# Patient Record
Sex: Female | Born: 2000 | Race: White | Hispanic: No | Marital: Single | Smoking: Never smoker
Health system: Southern US, Community
[De-identification: ages and names within clinical notes are randomized; demographics above are authoritative.]

## PROBLEM LIST (undated history)

## (undated) ENCOUNTER — Inpatient Hospital Stay (HOSPITAL_COMMUNITY): Payer: Self-pay

## (undated) ENCOUNTER — Emergency Department (HOSPITAL_COMMUNITY)

## (undated) DIAGNOSIS — Z789 Other specified health status: Secondary | ICD-10-CM

## (undated) HISTORY — DX: Other specified health status: Z78.9

## (undated) HISTORY — PX: TONGUE SURGERY: SHX810

---
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
## 2019-04-16 DIAGNOSIS — O0990 Supervision of high risk pregnancy, unspecified, unspecified trimester: Secondary | ICD-10-CM | POA: Insufficient documentation

## 2019-05-13 DIAGNOSIS — O99210 Obesity complicating pregnancy, unspecified trimester: Secondary | ICD-10-CM | POA: Insufficient documentation

## 2019-06-10 DIAGNOSIS — O35EXX0 Maternal care for other (suspected) fetal abnormality and damage, fetal genitourinary anomalies, not applicable or unspecified: Secondary | ICD-10-CM | POA: Insufficient documentation
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
## 2019-09-04 DIAGNOSIS — O1490 Unspecified pre-eclampsia, unspecified trimester: Secondary | ICD-10-CM

## 2019-09-06 DIAGNOSIS — D62 Acute posthemorrhagic anemia: Secondary | ICD-10-CM | POA: Insufficient documentation

## 2020-05-14 HISTORY — PX: CHOLECYSTECTOMY: SHX55

## 2021-12-18 DIAGNOSIS — G8918 Other acute postprocedural pain: Secondary | ICD-10-CM | POA: Insufficient documentation

## 2022-09-14 ENCOUNTER — Encounter (HOSPITAL_BASED_OUTPATIENT_CLINIC_OR_DEPARTMENT_OTHER): Payer: Self-pay | Admitting: Urology

## 2022-09-14 ENCOUNTER — Other Ambulatory Visit: Payer: Self-pay

## 2022-09-14 ENCOUNTER — Emergency Department (HOSPITAL_BASED_OUTPATIENT_CLINIC_OR_DEPARTMENT_OTHER)
Admission: EM | Admit: 2022-09-14 | Discharge: 2022-09-14 | Disposition: A | Discharge status: Home / Self Care | Payer: Medicaid Other | Attending: Emergency Medicine | Admitting: Emergency Medicine

## 2022-09-14 DIAGNOSIS — R519 Headache, unspecified: Secondary | ICD-10-CM | POA: Diagnosis present

## 2022-09-14 DIAGNOSIS — R000 Tachycardia, unspecified: Secondary | ICD-10-CM | POA: Insufficient documentation

## 2022-09-14 DIAGNOSIS — G44209 Tension-type headache, unspecified, not intractable: Secondary | ICD-10-CM | POA: Insufficient documentation

## 2022-09-14 LAB — URINALYSIS, ROUTINE W REFLEX MICROSCOPIC
Bilirubin Urine: NEGATIVE
Glucose, UA: NEGATIVE mg/dL
Ketones, ur: NEGATIVE mg/dL
Leukocytes,Ua: NEGATIVE
Nitrite: NEGATIVE
Protein, ur: NEGATIVE mg/dL
Specific Gravity, Urine: 1.02 (ref 1.005–1.030)
pH: 7.5 (ref 5.0–8.0)

## 2022-09-14 LAB — CBC WITH DIFFERENTIAL/PLATELET
Abs Immature Granulocytes: 0.04 10*3/uL (ref 0.00–0.07)
Basophils Absolute: 0.1 10*3/uL (ref 0.0–0.1)
Basophils Relative: 1 %
Eosinophils Absolute: 0.1 10*3/uL (ref 0.0–0.5)
Eosinophils Relative: 1 %
HCT: 38 % (ref 36.0–46.0)
Hemoglobin: 13.1 g/dL (ref 12.0–15.0)
Immature Granulocytes: 0 %
Lymphocytes Relative: 21 %
Lymphs Abs: 2.4 10*3/uL (ref 0.7–4.0)
MCH: 31.1 pg (ref 26.0–34.0)
MCHC: 34.5 g/dL (ref 30.0–36.0)
MCV: 90.3 fL (ref 80.0–100.0)
Monocytes Absolute: 0.8 10*3/uL (ref 0.1–1.0)
Monocytes Relative: 7 %
Neutro Abs: 7.8 10*3/uL — ABNORMAL HIGH (ref 1.7–7.7)
Neutrophils Relative %: 70 %
Platelets: 248 10*3/uL (ref 150–400)
RBC: 4.21 MIL/uL (ref 3.87–5.11)
RDW: 12.2 % (ref 11.5–15.5)
WBC: 11.2 10*3/uL — ABNORMAL HIGH (ref 4.0–10.5)
nRBC: 0 % (ref 0.0–0.2)

## 2022-09-14 LAB — URINALYSIS, MICROSCOPIC (REFLEX): WBC, UA: NONE SEEN WBC/hpf (ref 0–5)

## 2022-09-14 LAB — BASIC METABOLIC PANEL
Anion gap: 8 (ref 5–15)
BUN: 10 mg/dL (ref 6–20)
CO2: 24 mmol/L (ref 22–32)
Calcium: 9 mg/dL (ref 8.9–10.3)
Chloride: 107 mmol/L (ref 98–111)
Creatinine, Ser: 0.81 mg/dL (ref 0.44–1.00)
GFR, Estimated: >60 mL/min (ref >60)
Glucose, Bld: 109 mg/dL — ABNORMAL HIGH (ref 70–99)
Potassium: 3.5 mmol/L (ref 3.5–5.1)
Sodium: 139 mmol/L (ref 135–145)

## 2022-09-14 LAB — PREGNANCY, URINE: Preg Test, Ur: NEGATIVE

## 2022-09-14 MED ORDER — SODIUM CHLORIDE 0.9 % IV BOLUS
1000.0000 mL | Freq: Once | INTRAVENOUS | Status: AC
  Administered 2022-09-14: 1000 mL via INTRAVENOUS

## 2022-09-14 MED ORDER — PROCHLORPERAZINE EDISYLATE 10 MG/2ML IJ SOLN
10.0000 mg | Freq: Once | INTRAMUSCULAR | Status: AC
  Administered 2022-09-14: 10 mg via INTRAVENOUS
  Filled 2022-09-14: qty 2

## 2022-09-14 MED ORDER — HYDROXYZINE HCL 25 MG PO TABS
50.0000 mg | ORAL_TABLET | Freq: Once | ORAL | Status: AC
  Administered 2022-09-14: 50 mg via ORAL
  Filled 2022-09-14: qty 2

## 2022-09-14 NOTE — ED Provider Notes (Cosign Needed Addendum)
[place] EMERGENCY DEPARTMENT AT [facility] Provider Note   CSN: [id number] Arrival date & time: 09/14/22  1959     History  Chief Complaint  Patient presents with   Headache    Miranda Hayes is a 22 y.o. female history of migraines on abortive meds, anxiety presented with headache that began this morning.  Patient states she took her abortive medication including maxalt and Topamax.  She is also on hydroxyzine for anxiety and has been unable to take her medication due to her headache.  Patient is able to tolerate food and fluids orally but does endorse nausea throughout the day but not nauseous at the moment.  Describes the headache as a band around her head that is squeezing.  Patient notes that she had some black spots in her vision earlier and that after she took her Maxalt the black spots resolved along with the pressure behind her eye but still endorses a bandlike sensation around her head.  Patient dates that she is currently very anxious about this headache and would like anxiety medication as well.  Patient denied chest pain, shortness of breath, vomiting, abdominal pain, changes sensation/motor skills, neck pain, dysuria, recent illnesses, fever/chills, jaw pain, trauma  Home Medications Prior to Admission medications   Medication Sig Start Date End Date Taking? Authorizing Provider  levonorgestrel-ethinyl estradiol (VIENVA) 0.1-20 MG-MCG tablet Take 1 tablet by mouth daily.    [provider]      Allergies    Patient has no known allergies.    Review of Systems   Review of Systems  Neurological:  Positive for headaches.    Physical Exam Updated Vital Signs BP 116/85 (BP Location: Left Arm)   Pulse (!) 108   Temp 98.3 F (36.8 C) (Oral)   Resp 18   Ht 5\' 6"  (1.676 m)   Wt 91.2 kg   LMP 08/14/2022   SpO2 100%   BMI 32.45 kg/m  Physical Exam Vitals reviewed.  Constitutional:      General: She is not in acute distress. HENT:     Head:  Normocephalic and atraumatic.  Eyes:     Extraocular Movements: Extraocular movements intact.     Conjunctiva/sclera: Conjunctivae normal.     Pupils: Pupils are equal, round, and reactive to light.     Comments: No photophobia  Cardiovascular:     Rate and Rhythm: Regular rhythm. Tachycardia present.     Pulses: Normal pulses.     Heart sounds: Normal heart sounds.     Comments: 2+ bilateral radial/dorsalis pedis pulses with increased rate Pulmonary:     Effort: Pulmonary effort is normal. No respiratory distress.     Breath sounds: Normal breath sounds.  Abdominal:     Palpations: Abdomen is soft.     Tenderness: There is no abdominal tenderness. There is no guarding or rebound.  Musculoskeletal:        General: Normal range of motion.     Cervical back: Normal range of motion and neck supple.     Comments: 5 out of 5 bilateral grip/leg extension strength  Skin:    General: Skin is warm and dry.     Capillary Refill: Capillary refill takes less than 2 seconds.  Neurological:     General: No focal deficit present.     Mental Status: She is alert and oriented to person, place, and time.     Comments: Sensation intact in all 4 limbs Cranial nerves III through XII intact Vision  grossly intact Able to ambulate without abnormalities  Psychiatric:        Mood and Affect: Mood normal.     Comments: Anxious     ED Results / Procedures / Treatments   Labs (all labs ordered are listed, but only abnormal results are displayed) Labs Reviewed  URINALYSIS, ROUTINE W REFLEX MICROSCOPIC - Abnormal; Notable for the following components:      Result Value   APPearance HAZY (*)    Hgb urine dipstick TRACE (*)    All other components within normal limits  BASIC METABOLIC PANEL - Abnormal; Notable for the following components:   Glucose, Bld 109 (*)    All other components within normal limits  CBC WITH DIFFERENTIAL/PLATELET - Abnormal; Notable for the following components:   WBC 11.2  (*)    Neutro Abs 7.8 (*)    All other components within normal limits  URINALYSIS, MICROSCOPIC (REFLEX) - Abnormal; Notable for the following components:   Bacteria, UA FEW (*)    All other components within normal limits  PREGNANCY, URINE    EKG None  Radiology No results found.  Procedures Procedures    Medications Ordered in ED Medications  sodium chloride 0.9 % bolus 1,000 mL (1,000 mLs Intravenous New Bag/Given 09/14/22 2144)  hydrOXYzine (ATARAX) tablet 50 mg (50 mg Oral Given 09/14/22 2059)  prochlorperazine (COMPAZINE) injection 10 mg (10 mg Intravenous Given 09/14/22 2147)    ED Course/ Medical Decision Making/ A&P                             Medical Decision Making Amount and/or Complexity of Data Reviewed Labs: ordered.  Risk Prescription drug management.   Hassell Halim 23 y.o. presented today for headache and anxiety. Working DDx that I considered at this time includes, but not limited to, tension headache, migraine intracranial mass, intracranial hemorrhage, intracranial infection including meningitis vs encephalitis, GCA, trigeminal neuralgia.  R/o DDx: Migraine: These are considered less likely due to history of present illness and physical exam findings SAH/ICH: Timeline and slow onset is not consistent with SAH/ICH  GCA: Age and description of pain is not consistent with GCA  Meningitis/encephalitis: Lack of fever,meningismus is not consistent Intracranial Mass:  less likely due to history of present illness and physical exam findings Trigeminal Neuralgia: headache does not meet this description Intracranial Infection:  less likely due to history of present illness and physical exam findings, no fevers  Review of prior external notes: 11/13/2021 ED  Unique Tests and My Interpretation:  CBC: Unremarkable CMP: Unremarkable UA: Unremarkable UPT: Negative  Discussion with Independent Historian: None  Discussion of Management of Tests:  None  Risk: Medium: prescription drug management  Risk Stratification Score: None  Plan: Patient presented for HA. On exam patient was anxious but had stable vitals . Physical exam was unremarkable. Labs will be ordered.  Due to patient having an atraumatic history with this headache, being young, having an unremarkable physical exam including neuroexam a CT scan will be withheld at this time.  Patient will be given Compazine for HA treatment.  Patient also be given her hydroxyzine to ease her anxiety as she is not taken her medication today.  Patient stable at this time.  Patient's labs came back reassuring.  On recheck patient stated that she felt much better after the Compazine and Atarax and is agreeable to discharge.  I spoke to the patient about continuing her medication at home for  headaches and anxiety and I suspect the migraine headache medication worked for her migraine however anxiety induced a tension headache that resolved with the Compazine.  Patient has not been nauseous or had episodes of emesis throughout ED visit and is stable for discharge with outpatient follow-up at this time.  Patient was given return precautions. Patient stable for discharge at this time.  Patient verbalized understanding of plan.   Final Clinical Impression(s) / ED Diagnoses Final diagnoses:  Tension headache    Rx / DC Orders ED Discharge Orders     None         Remi Deter 09/14/22 2217    Lonell Grandchild, MD 09/15/22 1217

## 2022-09-14 NOTE — ED Triage Notes (Signed)
Pt states she is having a bad headache and is seeing spots again States it started at 1300 today  Pt very tearful and hyperventilating due to pain  Sensitive to light and sound   H/o migraine and took meds

## 2022-09-14 NOTE — ED Notes (Signed)
Pt now having panic attack, hyperventilating and gripping chest, EKG ordered  Pt has history of panic attacks and anxiety  Did not take anxiety meds today

## 2022-09-14 NOTE — ED Notes (Signed)
Patient wants anxiety medication prior to IV and lab draw.  PA aware

## 2022-09-14 NOTE — Discharge Instructions (Addendum)
Please follow-up with your primary care provider regarding recent symptoms and ER visit.  Today your labs were reassuring and you improved on the Compazine but please continue to take your medications as prescribed.  I suspect your anxiety induced a tension headache on top of your migraine which resolved with the Compazine.  If symptoms worsen please return to ER.

## 2022-10-18 DIAGNOSIS — E559 Vitamin D deficiency, unspecified: Secondary | ICD-10-CM | POA: Insufficient documentation

## 2023-03-05 DIAGNOSIS — F419 Anxiety disorder, unspecified: Secondary | ICD-10-CM | POA: Insufficient documentation

## 2023-03-05 DIAGNOSIS — F330 Major depressive disorder, recurrent, mild: Secondary | ICD-10-CM | POA: Insufficient documentation

## 2023-11-21 ENCOUNTER — Encounter (HOSPITAL_COMMUNITY): Payer: Self-pay | Admitting: Obstetrics & Gynecology

## 2023-11-21 ENCOUNTER — Inpatient Hospital Stay (HOSPITAL_COMMUNITY)
Admission: AD | Admit: 2023-11-21 | Discharge: 2023-11-21 | Disposition: A | Discharge status: Home / Self Care | Attending: Obstetrics & Gynecology | Admitting: Obstetrics & Gynecology

## 2023-11-21 DIAGNOSIS — O23591 Infection of other part of genital tract in pregnancy, first trimester: Secondary | ICD-10-CM | POA: Diagnosis not present

## 2023-11-21 DIAGNOSIS — N760 Acute vaginitis: Secondary | ICD-10-CM

## 2023-11-21 DIAGNOSIS — Z3A11 11 weeks gestation of pregnancy: Secondary | ICD-10-CM | POA: Diagnosis not present

## 2023-11-21 DIAGNOSIS — O219 Vomiting of pregnancy, unspecified: Secondary | ICD-10-CM | POA: Diagnosis not present

## 2023-11-21 DIAGNOSIS — B9689 Other specified bacterial agents as the cause of diseases classified elsewhere: Secondary | ICD-10-CM

## 2023-11-21 LAB — URINALYSIS, ROUTINE W REFLEX MICROSCOPIC
Bacteria, UA: NONE SEEN
Bilirubin Urine: NEGATIVE
Glucose, UA: NEGATIVE mg/dL
Hgb urine dipstick: NEGATIVE
Ketones, ur: 20 mg/dL — AB
Leukocytes,Ua: NEGATIVE
Nitrite: NEGATIVE
Protein, ur: NEGATIVE mg/dL
Specific Gravity, Urine: 1.029 (ref 1.005–1.030)
pH: 5 (ref 5.0–8.0)

## 2023-11-21 LAB — WET PREP, GENITAL
Sperm: NONE SEEN
Trich, Wet Prep: NONE SEEN
WBC, Wet Prep HPF POC: <10 (ref <10)
Yeast Wet Prep HPF POC: NONE SEEN

## 2023-11-21 MED ORDER — PROMETHAZINE HCL 25 MG PO TABS: 25.0000 mg | ORAL_TABLET | Freq: Every evening | ORAL | Status: DC | PRN

## 2023-11-21 MED ORDER — METOCLOPRAMIDE HCL 10 MG PO TABS: 10.0000 mg | ORAL_TABLET | Freq: Three times a day (TID) | ORAL | 0 refills | Status: DC | PRN

## 2023-11-21 MED ORDER — METRONIDAZOLE 500 MG PO TABS: 500.0000 mg | ORAL_TABLET | Freq: Two times a day (BID) | ORAL | 0 refills | Status: AC

## 2023-11-21 NOTE — MAU Provider Note (Signed)
 Chief Complaint: Emesis During Pregnancy   None     SUBJECTIVE HPI: Miranda Hayes is a 23 y.o. G2P0100 at [redacted]w[redacted]d by LMP who presents to maternity admissions reporting nausea, unable to eat or drink much and some abdominal cramping.  Pt has appt with [facility] to begin prenatal care.  She has Phenergan  Rx but is unable to take it during the day due to drowsiness. She denies vaginal bleeding, vaginal itching/burning, urinary symptoms, h/a, dizziness, or fever/chills.     HPI  No past medical history on file. No past surgical history on file. Social History   Socioeconomic History   Marital status: Single    Spouse name: Not on file   Number of children: Not on file   Years of education: Not on file   Highest education level: Not on file  Occupational History   Not on file  Tobacco Use   Smoking status: Never   Smokeless tobacco: Never  Vaping Use   Vaping status: Never Used  Substance and Sexual Activity   Alcohol use: Not Currently   Drug use: Not Currently   Sexual activity: Yes  Other Topics Concern   Not on file  Social History Narrative   Not on file   Social Drivers of Health   Financial Resource Strain: Low Risk  (10/17/2022)   Received from [facility]   Overall Financial Resource Strain (CARDIA)    Difficulty of Paying Living Expenses: Not hard at all  Food Insecurity: No Food Insecurity (10/17/2022)   Received from [facility]   Hunger Vital Sign    Within the past 12 months, you worried that your food would run out before you got the money to buy more.: Never true    Within the past 12 months, the food you bought just didn't last and you didn't have money to get more.: Never true  Transportation Needs: No Transportation Needs (10/17/2022)   Received from [facility] - Transportation    Lack of Transportation (Medical): No    Lack of Transportation (Non-Medical): No  Physical Activity: Insufficiently Active (02/20/2019)   [facility] visits prior to 07/14/2022.   Exercise Vital Sign    On average, how many days per week do you engage in moderate to strenuous exercise (like a brisk walk)?: 1 day    On average, how many minutes do you engage in exercise at this level?: 30 min  Stress: No Stress Concern Present (12/13/2021)   Received from [facility] - Occupational Stress Questionnaire    Feeling of Stress : Not at all  Social Connections: Unknown (09/22/2021)   Received from [facility]   Social Network    Social Network: Not on file  Intimate Partner Violence: Unknown (08/15/2021)   Received from [facility]   HITS    Physically Hurt: Not on file    Insult or Talk Down To: Not on file    Threaten Physical Harm: Not on file    Scream or Curse: Not on file   No current facility-administered medications on file prior to encounter.   No current outpatient medications on file prior to encounter.   No Known Allergies  ROS:  Review of Systems  Constitutional:  Negative for chills, fatigue and fever.  Respiratory:  Negative for shortness of breath.   Cardiovascular:  Negative for chest pain.  Gastrointestinal:  Positive for abdominal pain, nausea and vomiting.  Genitourinary:  Negative for difficulty urinating, dysuria, flank pain, pelvic pain, vaginal bleeding, vaginal discharge and vaginal pain.  Neurological:  Negative for dizziness and headaches.  Psychiatric/Behavioral: Negative.       I have reviewed patient's Past Medical Hx, Surgical Hx, Family Hx, Social Hx, medications and allergies.   Physical Exam  Patient Vitals for the past 24 hrs:  BP Temp Pulse Resp SpO2 Height Weight  11/21/23 2014 118/80 -- -- -- -- -- --  11/21/23 2010 -- 98.7 F (37.1 C) 68 17 100 % 5' 7 (1.702 m) 103.9 kg   Constitutional: Well-developed, well-nourished female in no acute distress.  Cardiovascular: normal rate Respiratory: normal effort GI: Abd  soft, non-tender. Pos BS x 4 MS: Extremities nontender, no edema, normal ROM Neurologic: Alert and oriented x 4.  GU: Neg CVAT.  PELVIC EXAM: Deferred  FHT 160 by doppler  LAB RESULTS Results for orders placed or performed during the hospital encounter of 11/21/23 (from the past 24 hours)  Wet prep, genital     Status: Abnormal   Collection Time: 11/21/23  8:30 PM   Specimen: Urine, Clean Catch  Result Value Ref Range   Yeast Wet Prep HPF POC NONE SEEN NONE SEEN   Trich, Wet Prep NONE SEEN NONE SEEN   Clue Cells Wet Prep HPF POC PRESENT (A) NONE SEEN   WBC, Wet Prep HPF POC <10 <10   Sperm NONE SEEN   Urinalysis, Routine w reflex microscopic -Urine, Clean Catch     Status: Abnormal   Collection Time: 11/21/23  8:32 PM  Result Value Ref Range   Color, Urine AMBER (A) YELLOW   APPearance HAZY (A) CLEAR   Specific Gravity, Urine 1.029 1.005 - 1.030   pH 5.0 5.0 - 8.0   Glucose, UA NEGATIVE NEGATIVE mg/dL   Hgb urine dipstick NEGATIVE NEGATIVE   Bilirubin Urine NEGATIVE NEGATIVE   Ketones, ur 20 (A) NEGATIVE mg/dL   Protein, ur NEGATIVE NEGATIVE mg/dL   Nitrite NEGATIVE NEGATIVE   Leukocytes,Ua NEGATIVE NEGATIVE   RBC / HPF 0-5 0 - 5 RBC/hpf   WBC, UA 0-5 0 - 5 WBC/hpf   Bacteria, UA NONE SEEN NONE SEEN   Squamous Epithelial / HPF 11-20 0 - 5 /HPF   Mucus PRESENT        IMAGING No results found.  MAU Management/MDM: Orders Placed This Encounter  Procedures   Wet prep, genital   Urinalysis, Routine w reflex microscopic -Urine, Clean Catch   Discharge patient Discharge disposition: 01-Home or Self Care; Discharge patient date: 11/21/2023    Meds ordered this encounter  Medications   promethazine  (PHENERGAN ) 25 MG tablet    Sig: Take 1 tablet (25 mg total) by mouth at bedtime and may repeat dose one time if needed.    Supervising Provider:   FORSYTH, KYMBERLY C [[id number]]   metoCLOPramide  (REGLAN ) 10 MG tablet    Sig: Take 1 tablet (10 mg total) by mouth 3 (three)  times daily with meals as needed for nausea.    Dispense:  30 tablet    Refill:  0    Supervising Provider:   FORSYTH, KYMBERLY C [[id number]]   metroNIDAZOLE  (FLAGYL ) 500 MG tablet    Sig: Take 1 tablet (500 mg total) by mouth 2 (two) times daily for 7 days.    Dispense:  14 tablet    Refill:  0    Supervising Provider:   ERIK KIETH BROCKS [[id number]]    FHT by doppler today  wnl.  Pt unable to take nausea medication due to nausea.  Rx for Reglan  TID PRN at mealtimes and pt to take Phenergan  Q HS PRN.  Clue cells so BV could be contributing to cramping. Rx for Flagyl .  Pt may wait until nausea improved before trying Flagyl  and to take with meals.  [facility] as scheduled, return to MAU as needed for emergencies.    ASSESSMENT 1. Nausea and vomiting during pregnancy   2. [redacted] weeks gestation of pregnancy   3. Bacterial vaginosis     PLAN Discharge home Allergies as of 11/21/2023   No Known Allergies      Medication List     STOP taking these medications    Vienva 0.1-20 MG-MCG tablet Generic drug: levonorgestrel-ethinyl estradiol       TAKE these medications    metoCLOPramide  10 MG tablet Commonly known as: REGLAN  Take 1 tablet (10 mg total) by mouth 3 (three) times daily with meals as needed for nausea.   metroNIDAZOLE  500 MG tablet Commonly known as: FLAGYL  Take 1 tablet (500 mg total) by mouth 2 (two) times daily for 7 days.   promethazine  25 MG tablet Commonly known as: PHENERGAN  Take 1 tablet (25 mg total) by mouth at bedtime and may repeat dose one time if needed.        Follow-up Information     Your [facility] Provider for P[facility]atal [facility] Follow up.   Why: As scheduled        [facility] Follow up.   Specialty: Obstetrics and Gynecology Why: As needed for emergencies Contact information: [address] [place]  [address] [phone]                Olam Boards Certified  Nurse-Midwife 11/21/2023  10:16 PM

## 2023-11-21 NOTE — Progress Notes (Signed)
 Olam Rollie Fuelling CNM in to see pt and discuss test results. Written and verbal d/c instructions given and pt then d/c home by [facility]

## 2023-11-21 NOTE — MAU Note (Addendum)
 Miranda Hayes is a 23 y.o. at [redacted]w[redacted]d here in MAU reporting unable to eat a lot for 2 wks. Has nausea meds that helps nausea but does not help her to eat. Abd cramping for couple days off and on but no pain now. Cramping worse with walking.  Scant amt pink spotting yesterday. Has [facility] care at [facility] in [place]  LMP: 09/03/23 Onset of complaint: 2 days Pain score: 0 Vitals:   11/21/23 2010 11/21/23 2014  BP:  118/80  Pulse: 68   Resp: 17   Temp: 98.7 F (37.1 C)   SpO2: 100%      FHT: did not listen  Lab orders placed from triage: u/a, wet prep and gc/chlam

## 2023-11-22 LAB — GC/CHLAMYDIA PROBE AMP (~~LOC~~) NOT AT ARMC
Chlamydia: NEGATIVE
Comment: NEGATIVE
Comment: NORMAL
Neisseria Gonorrhea: NEGATIVE

## 2023-11-28 DIAGNOSIS — O3510X0 Maternal care for (suspected) chromosomal abnormality in fetus, unspecified, not applicable or unspecified: Secondary | ICD-10-CM | POA: Insufficient documentation

## 2023-11-28 DIAGNOSIS — O09899 Supervision of other high risk pregnancies, unspecified trimester: Secondary | ICD-10-CM | POA: Insufficient documentation

## 2023-11-28 DIAGNOSIS — O09299 Supervision of pregnancy with other poor reproductive or obstetric history, unspecified trimester: Secondary | ICD-10-CM | POA: Insufficient documentation

## 2023-11-28 LAB — HEPATITIS C ANTIBODY: HCV Ab: NEGATIVE

## 2023-11-28 LAB — OB RESULTS CONSOLE HEPATITIS B SURFACE ANTIGEN: Hepatitis B Surface Ag: NEGATIVE

## 2023-11-29 DIAGNOSIS — O99320 Drug use complicating pregnancy, unspecified trimester: Secondary | ICD-10-CM | POA: Insufficient documentation

## 2023-11-29 LAB — OB RESULTS CONSOLE RUBELLA ANTIBODY, IGM: Rubella: IMMUNE

## 2024-01-29 ENCOUNTER — Telehealth (INDEPENDENT_AMBULATORY_CARE_PROVIDER_SITE_OTHER)

## 2024-01-29 DIAGNOSIS — O0992 Supervision of high risk pregnancy, unspecified, second trimester: Secondary | ICD-10-CM

## 2024-01-29 DIAGNOSIS — O0990 Supervision of high risk pregnancy, unspecified, unspecified trimester: Secondary | ICD-10-CM | POA: Insufficient documentation

## 2024-01-29 DIAGNOSIS — Z3A21 21 weeks gestation of pregnancy: Secondary | ICD-10-CM

## 2024-01-29 DIAGNOSIS — O35EXX0 Maternal care for other (suspected) fetal abnormality and damage, fetal genitourinary anomalies, not applicable or unspecified: Secondary | ICD-10-CM

## 2024-01-29 DIAGNOSIS — Z8759 Personal history of other complications of pregnancy, childbirth and the puerperium: Secondary | ICD-10-CM

## 2024-01-29 MED ORDER — GOJJI WEIGHT SCALE MISC: 1.0000 | Freq: Once | 0 refills | Status: DC

## 2024-01-29 MED ORDER — BLOOD PRESSURE KIT DEVI: 1.0000 | Freq: Once | 0 refills | Status: DC

## 2024-01-29 MED ORDER — BLOOD PRESSURE KIT DEVI: 1.0000 | Freq: Once | 0 refills | Status: AC

## 2024-01-29 MED ORDER — GOJJI WEIGHT SCALE MISC: 1.0000 | Freq: Once | 0 refills | Status: AC

## 2024-01-29 NOTE — Patient Instructions (Signed)

## 2024-01-29 NOTE — Progress Notes (Addendum)
 New OB Intake  I connected with Emya Kitchings  on 01/29/24 at  1:15 PM EDT by MyChart Video Visit and verified that I am speaking with the correct person using two identifiers. Nurse is located at [facility] and pt is located at Home.  I discussed the limitations, risks, security and privacy concerns of performing an evaluation and management service by telephone and the availability of in person appointments. I also discussed with the patient that there may be a patient responsible charge related to this service. The patient expressed understanding and agreed to proceed.  I explained I am completing New OB Intake today. We discussed EDD of 06/07/24 based on US  at 12 weeks. Pt is G2P0101. I reviewed her allergies, medications and Medical/Surgical/OB history.   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  Advised pt that [facility] offers genetic counseling.    Delivery Plans Plans to deliver at [facility]. Discussed the nature of our practice with multiple providers including residents and students as well as female and female providers. Due to the size of the practice, the delivering provider may not be the same as those providing prenatal care.   Patient is not interested in water birth.  MyChart/Babyscripts MyChart access verified. I explained pt will have some visits in office and some virtually. Babyscripts instructions given and order placed.   Blood Pressure Cuff/Weight Scale Blood pressure cuff ordered for patient to pick-up  from [employer]. Explained after first prenatal appt pt will check weekly and document in Babyscripts. Patient does not have weight scale; order sent to [facility], patient may track weight weekly in Babyscripts.  Anatomy US  Explained first scheduled US  will be around 19 weeks. Anatomy US  scheduled for 02/26/24  at  07:45am [facility].  Provided pt with MFM phone number if she wishes to call to see if they have any cancellations.   Is patient a CenteringPregnancy candidate?  Declined due to Declined to say  Is patient a Mom+Baby Combined Care candidate?  Not a candidate     Is patient a candidate for Babyscripts Optimization? No, due to HROB.   First visit review I reviewed new OB appt with patient. Explained pt will be seen by Dr. Eveline at first visit 02/10/24 at 2:55. Discussed [place] genetic screening with patient. Panorama and Horizon were drawn by previous care. Routine prenatal labs collected previously, see [facility] records.  Last Pap No results found for: DIAGPAP  Mylik Pro L Lenni Reckner, RN 01/29/2024  2:14 PM

## 2024-02-10 ENCOUNTER — Encounter: Payer: Self-pay | Admitting: Obstetrics and Gynecology

## 2024-02-10 ENCOUNTER — Ambulatory Visit: Payer: Self-pay | Admitting: Obstetrics and Gynecology

## 2024-02-10 ENCOUNTER — Other Ambulatory Visit (HOSPITAL_COMMUNITY)
Admission: RE | Admit: 2024-02-10 | Discharge: 2024-02-10 | Disposition: A | Discharge status: Home / Self Care | Source: Ambulatory Visit | Attending: Obstetrics and Gynecology | Admitting: Obstetrics and Gynecology

## 2024-02-10 VITALS — BP 102/74 | HR 96 | Wt 234.0 lb

## 2024-02-10 DIAGNOSIS — Q999 Chromosomal abnormality, unspecified: Secondary | ICD-10-CM

## 2024-02-10 DIAGNOSIS — O0992 Supervision of high risk pregnancy, unspecified, second trimester: Secondary | ICD-10-CM | POA: Diagnosis not present

## 2024-02-10 DIAGNOSIS — O0990 Supervision of high risk pregnancy, unspecified, unspecified trimester: Secondary | ICD-10-CM

## 2024-02-10 DIAGNOSIS — Z1332 Encounter for screening for maternal depression: Secondary | ICD-10-CM

## 2024-02-10 DIAGNOSIS — O09892 Supervision of other high risk pregnancies, second trimester: Secondary | ICD-10-CM | POA: Diagnosis not present

## 2024-02-10 DIAGNOSIS — O09899 Supervision of other high risk pregnancies, unspecified trimester: Secondary | ICD-10-CM

## 2024-02-10 DIAGNOSIS — N898 Other specified noninflammatory disorders of vagina: Secondary | ICD-10-CM | POA: Diagnosis not present

## 2024-02-10 DIAGNOSIS — Z8759 Personal history of other complications of pregnancy, childbirth and the puerperium: Secondary | ICD-10-CM

## 2024-02-10 DIAGNOSIS — Z3A22 22 weeks gestation of pregnancy: Secondary | ICD-10-CM

## 2024-02-10 DIAGNOSIS — G44009 Cluster headache syndrome, unspecified, not intractable: Secondary | ICD-10-CM | POA: Insufficient documentation

## 2024-02-10 NOTE — Progress Notes (Signed)
 INITIAL PRENATAL VISIT  Subjective:   Miranda Hayes is being seen today for her first obstetrical visit. She is at [redacted]w[redacted]d gestation by LMP Her obstetrical history is significant for hx PTL and PTD at 24 weeks .  Patient does intend to breast feed. Pregnancy history fully reviewed.  Patient reports no complaints.  Indications for ASA therapy (per uptodate) One of the following: Previous pregnancy with preeclampsia, especially early onset and with an adverse outcome Yes Multifetal gestation No Chronic hypertension No Type 1 or 2 diabetes mellitus No Chronic kidney disease No Autoimmune disease (antiphospholipid syndrome, systemic lupus erythematosus) No   Objective:    Obstetric History OB History  Gravida Para Term Preterm AB Living  2 1 0 1 0 1  SAB IAB Ectopic Multiple Live Births  0 0 0  1    # Outcome Date GA Lbr Len/2nd Weight Sex Type Anes PTL Lv  2 Current           1 Preterm 09/04/19 [redacted]w[redacted]d 04:00 / 00:31 5 lb 14.2 oz (2.67 kg) F Vag-Spont  Y LIV     Complications: Pre-eclampsia    Obstetric Comments  Pre-E first pregnancy.  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 Refer MFM.   Check kidneys again at 36 weeks.     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     aspirin EC 81 MG tablet Take 81 mg by mouth daily.     Prenatal Vit-Fe Fumarate-FA (MULTIVITAMIN-PRENATAL) 27-0.8 MG TABS tablet Take 1 tablet by mouth daily at 12 noon.     Ferrous Sulfate (IRON PO) Take 1 tablet by mouth daily. (Patient not taking: Reported on 02/10/2024)     metoCLOPramide  (REGLAN ) 10 MG tablet Take 1 tablet (10 mg total) by mouth 3 (three) times daily with meals as needed for nausea. (Patient not taking: Reported on 02/10/2024) 30 tablet 0   promethazine  (PHENERGAN ) 25 MG tablet Take 1 tablet (25 mg total) by mouth at bedtime and may repeat dose one time if needed. (Patient not  taking: Reported on 02/10/2024)     No current facility-administered medications on file prior to visit.    Allergies  Allergen Reactions   Latex Dermatitis and Itching    Social History:  reports that she has never smoked. She has never used smokeless tobacco. She reports that she does not currently use alcohol. She reports that she does not currently use drugs after having used the following drugs: Marijuana.  Family History  Problem Relation Age of Onset   Healthy Mother    Healthy Father     The following portions of the patient's history were reviewed and updated as appropriate: allergies, current medications, past family history,  past medical history, past social history, past surgical history and problem list.  Review of Systems Review of Systems    Physical Exam:  BP 102/74   Pulse 96   Wt 234 lb (106.1 kg)   LMP 09/03/2023   BMI 36.65 kg/m  CONSTITUTIONAL: Well-developed, well-nourished female in no acute distress.  HENT:  Normocephalic, atraumatic.   EYES: Conjunctivae normal. NECK: Normal range of motion SKIN: Skin is warm and dry. MUSCULOSKELETAL: Normal range of motion NEUROLOGIC: Alert and oriented  PSYCHIATRIC: Normal mood and affect. Normal behavior.  CARDIOVASCULAR: Normal heart rate noted RESPIRATORY: normal effort ABDOMEN: Soft PELVIC:deferred   Fetal Heart Rate (bpm): 147           Assessment:    Pregnancy: G2P0101  1. Supervision of high risk pregnancy, antepartum (Primary) BP and FHR normal Doing well overall  - Cytology - PAP( [place])   2. History of pre-eclampsia 3. History of preterm delivery, currently pregnant Noted at the end of pregnancy, no meds  Delivered 36 weeks  Continue ASA  4. Vaginal odor  - Cervicovaginal ancillary only  ancillary only  5. History of chromosomal abnormality First child chromosome 14 deletion found out after delivery  Genetic counselor 10/15    Plan:     Initial labs drawn. Prenatal vitamins. Problem list reviewed and updated. Reviewed in detail the nature of the practice with collaborative care between  Genetic screening discussed: NIPS/First trimester screen/Quad/AFP results reviewed. Role of ultrasound in pregnancy discussed; Anatomy US : ordered. Discussed clinic routines, schedule of care and testing, genetic screening options, involvement of students and residents under the direct supervision of APPs and doctors and presence of female providers. Pt verbalized understanding.   Future Appointments  Date Time Provider Department Center  02/26/2024  7:45 AM [facility] PROVIDER 1 [facility] None  02/26/2024  8:00 AM [facility]  US1 [facility]  02/26/2024  9:00 AM [facility] GENETIC RM [facility] None  03/12/2024  9:15 AM Eveline Lynwood MATSU, MD [facility]  03/12/2024 10:20 AM [facility]    Delores Nidia CROME, FNP

## 2024-02-11 ENCOUNTER — Ambulatory Visit: Payer: Self-pay | Admitting: Obstetrics and Gynecology

## 2024-02-11 LAB — CERVICOVAGINAL ANCILLARY ONLY
Bacterial Vaginitis (gardnerella): NEGATIVE
Candida Glabrata: NEGATIVE
Candida Vaginitis: NEGATIVE
Comment: NEGATIVE
Comment: NEGATIVE
Comment: NEGATIVE

## 2024-02-11 LAB — CYTOLOGY - PAP: Diagnosis: NEGATIVE

## 2024-02-18 ENCOUNTER — Encounter (HOSPITAL_BASED_OUTPATIENT_CLINIC_OR_DEPARTMENT_OTHER): Payer: Self-pay | Admitting: Emergency Medicine

## 2024-02-18 ENCOUNTER — Emergency Department (HOSPITAL_BASED_OUTPATIENT_CLINIC_OR_DEPARTMENT_OTHER)
Admission: EM | Admit: 2024-02-18 | Discharge: 2024-02-18 | Disposition: A | Discharge status: Home / Self Care | Attending: Emergency Medicine | Admitting: Emergency Medicine

## 2024-02-18 ENCOUNTER — Other Ambulatory Visit: Payer: Self-pay

## 2024-02-18 DIAGNOSIS — G43009 Migraine without aura, not intractable, without status migrainosus: Secondary | ICD-10-CM | POA: Diagnosis not present

## 2024-02-18 DIAGNOSIS — Z91040 Latex allergy status: Secondary | ICD-10-CM | POA: Insufficient documentation

## 2024-02-18 DIAGNOSIS — Z7982 Long term (current) use of aspirin: Secondary | ICD-10-CM | POA: Diagnosis not present

## 2024-02-18 DIAGNOSIS — O99352 Diseases of the nervous system complicating pregnancy, second trimester: Secondary | ICD-10-CM | POA: Insufficient documentation

## 2024-02-18 DIAGNOSIS — Z3A24 24 weeks gestation of pregnancy: Secondary | ICD-10-CM

## 2024-02-18 LAB — COMPREHENSIVE METABOLIC PANEL WITH GFR
ALT: 8 U/L (ref 0–44)
AST: 13 U/L — ABNORMAL LOW (ref 15–41)
Albumin: 3.6 g/dL (ref 3.5–5.0)
Alkaline Phosphatase: 82 U/L (ref 38–126)
Anion gap: 14 (ref 5–15)
BUN: 5 mg/dL — ABNORMAL LOW (ref 6–20)
CO2: 20 mmol/L — ABNORMAL LOW (ref 22–32)
Calcium: 9 mg/dL (ref 8.9–10.3)
Chloride: 102 mmol/L (ref 98–111)
Creatinine, Ser: 0.5 mg/dL (ref 0.44–1.00)
GFR, Estimated: >60 mL/min (ref >60)
Glucose, Bld: 123 mg/dL — ABNORMAL HIGH (ref 70–99)
Potassium: 3.8 mmol/L (ref 3.5–5.1)
Sodium: 136 mmol/L (ref 135–145)
Total Bilirubin: 0.3 mg/dL (ref 0.0–1.2)
Total Protein: 6.6 g/dL (ref 6.5–8.1)

## 2024-02-18 LAB — CBC WITH DIFFERENTIAL/PLATELET
Abs Immature Granulocytes: 0.07 K/uL (ref 0.00–0.07)
Basophils Absolute: 0 K/uL (ref 0.0–0.1)
Basophils Relative: 0 %
Eosinophils Absolute: 0.1 K/uL (ref 0.0–0.5)
Eosinophils Relative: 1 %
HCT: 36.8 % (ref 36.0–46.0)
Hemoglobin: 12.9 g/dL (ref 12.0–15.0)
Immature Granulocytes: 1 %
Lymphocytes Relative: 16 %
Lymphs Abs: 1.6 K/uL (ref 0.7–4.0)
MCH: 32.5 pg (ref 26.0–34.0)
MCHC: 35.1 g/dL (ref 30.0–36.0)
MCV: 92.7 fL (ref 80.0–100.0)
Monocytes Absolute: 0.4 K/uL (ref 0.1–1.0)
Monocytes Relative: 4 %
Neutro Abs: 8.2 K/uL — ABNORMAL HIGH (ref 1.7–7.7)
Neutrophils Relative %: 78 %
Platelets: 213 K/uL (ref 150–400)
RBC: 3.97 MIL/uL (ref 3.87–5.11)
RDW: 12.7 % (ref 11.5–15.5)
WBC: 10.5 K/uL (ref 4.0–10.5)
nRBC: 0 % (ref 0.0–0.2)

## 2024-02-18 LAB — URINALYSIS, W/ REFLEX TO CULTURE (INFECTION SUSPECTED)
Glucose, UA: 100 mg/dL — AB
Hgb urine dipstick: NEGATIVE
Ketones, ur: NEGATIVE mg/dL
Leukocytes,Ua: NEGATIVE
Nitrite: POSITIVE — AB
Protein, ur: 100 mg/dL — AB
Specific Gravity, Urine: >=1.03 (ref 1.005–1.030)
pH: 5.5 (ref 5.0–8.0)

## 2024-02-18 MED ORDER — METOCLOPRAMIDE HCL 10 MG PO TABS: 10.0000 mg | ORAL_TABLET | Freq: Four times a day (QID) | ORAL | 0 refills | Status: DC

## 2024-02-18 NOTE — Discharge Instructions (Addendum)
 Keep your appointment on October 15 to follow-up with your new OB/GYN group.  Return for any new or worse symptoms.  Take the Reglan  as needed for the migraine headaches.  Also we sent your urine for culture.  If it grows any significant infection you will be notified and antibiotic will be sent to your pharmacy.  Currently it is not clear whether there is a urinary tract infection or not.

## 2024-02-18 NOTE — ED Provider Notes (Addendum)
 [place] [facility] Provider Note   CSN: [id number] Arrival date & time: 02/18/24  1157     Patient presents with: Migraine   Miranda Hayes is a 23 y.o. female.   Patient here where she has been suffering with typical type migraine headaches on and off now for several weeks.  Patient is currently in between OB/GYN practices.  Started out with [facility] did have an ultrasound done by them.  Now switching over to [facility] as follow-up with [facility] on the 15th.  Patient without significant photophobia no fevers no nausea vomiting.  Patient states she has felt the baby moving has no concerns about the baby.  Patient is gravida 2 para 124 weeks pregnant.  Her due date is June 06, 2024.  Patient early on in this pregnancy was treated with Reglan  which helped a lot.  Urgent care did not feel comfortable with treating her.  Because of the pregnancy.  Patient had difficulties with migraines prior to the pregnancy and with her previous pregnancy they did get a little bit worse and more frequent.  Patient does not use tobacco products.       Prior to Admission medications   Medication Sig Start Date End Date Taking? Authorizing Provider  acetaminophen  (TYLENOL ) 325 MG tablet Take 650 mg by mouth every 6 (six) hours as needed. 09/06/19   [provider]  aspirin EC 81 MG tablet Take 81 mg by mouth daily. 12/26/23   [provider]  Ferrous Sulfate (IRON PO) Take 1 tablet by mouth daily. Patient not taking: Reported on 02/10/2024    [provider]  metoCLOPramide  (REGLAN ) 10 MG tablet Take 1 tablet (10 mg total) by mouth 3 (three) times daily with meals as needed for nausea. Patient not taking: Reported on 02/10/2024 11/21/23   Milly Olam LABOR, CNM  Prenatal Vit-Fe Fumarate-FA (MULTIVITAMIN-PRENATAL) 27-0.8 MG TABS tablet Take 1 tablet by mouth daily at 12 noon.    [provider]  promethazine  (PHENERGAN ) 25 MG tablet Take 1  tablet (25 mg total) by mouth at bedtime and may repeat dose one time if needed. Patient not taking: Reported on 02/10/2024 11/21/23   Milly Olam LABOR, CNM    Allergies: Latex    Review of Systems  Constitutional:  Negative for chills and fever.  HENT:  Negative for ear pain and sore throat.   Eyes:  Positive for photophobia. Negative for pain and visual disturbance.  Respiratory:  Negative for cough and shortness of breath.   Cardiovascular:  Negative for chest pain and palpitations.  Gastrointestinal:  Negative for abdominal pain and vomiting.  Genitourinary:  Negative for dysuria and hematuria.  Musculoskeletal:  Negative for arthralgias and back pain.  Skin:  Negative for color change and rash.  Neurological:  Positive for headaches. Negative for seizures, syncope, speech difficulty, weakness and numbness.  All other systems reviewed and are negative.   Updated Vital Signs BP 124/73   Pulse (!) 104   Temp 98.6 F (37 C) (Oral)   Resp 17   Wt 106.1 kg   LMP 09/03/2023   SpO2 97%   BMI 36.65 kg/m   Physical Exam Vitals and nursing note reviewed.  Constitutional:      General: She is not in acute distress.    Appearance: Normal appearance. She is well-developed. She is not ill-appearing.  HENT:     Head: Normocephalic and atraumatic.     Mouth/Throat:     Mouth: Mucous  membranes are moist.  Eyes:     Extraocular Movements: Extraocular movements intact.     Conjunctiva/sclera: Conjunctivae normal.     Pupils: Pupils are equal, round, and reactive to light.  Cardiovascular:     Rate and Rhythm: Normal rate and regular rhythm.     Heart sounds: No murmur heard. Pulmonary:     Effort: Pulmonary effort is normal. No respiratory distress.     Breath sounds: Normal breath sounds.  Abdominal:     Palpations: Abdomen is soft.     Tenderness: There is no abdominal tenderness. There is no guarding.  Musculoskeletal:        General: No swelling.     Cervical back:  Normal range of motion and neck supple.  Skin:    General: Skin is warm and dry.     Capillary Refill: Capillary refill takes less than 2 seconds.  Neurological:     General: No focal deficit present.     Mental Status: She is alert and oriented to person, place, and time.     Cranial Nerves: No cranial nerve deficit.     Sensory: No sensory deficit.     Motor: No weakness.     Coordination: Coordination normal.  Psychiatric:        Mood and Affect: Mood normal.     (all labs ordered are listed, but only abnormal results are displayed) Labs Reviewed  CBC WITH DIFFERENTIAL/PLATELET - Abnormal; Notable for the following components:      Result Value   Neutro Abs 8.2 (*)    All other components within normal limits  COMPREHENSIVE METABOLIC PANEL WITH GFR - Abnormal; Notable for the following components:   CO2 20 (*)    Glucose, Bld 123 (*)    BUN 5 (*)    AST 13 (*)    All other components within normal limits  URINALYSIS, W/ REFLEX TO CULTURE (INFECTION SUSPECTED) - Abnormal; Notable for the following components:   Color, Urine AMBER (*)    APPearance CLOUDY (*)    Glucose, UA 100 (*)    Bilirubin Urine SMALL (*)    Protein, ur 100 (*)    Nitrite POSITIVE (*)    Bacteria, UA FEW (*)    All other components within normal limits    EKG: None  Radiology: No results found.   Procedures   Medications Ordered in the ED - No data to display                                  Medical Decision Making Amount and/or Complexity of Data Reviewed Labs: ordered.  Risk Prescription drug management.   Patient concerned about the baby.  But will offer to do fetal heart tones.  CBC white count 10.5 hemoglobin 12.9.  Platelets normal at 213.  Complete metabolic panel renal functions normal LFTs are normal.  Urinalysis significant for positive nitrites but leukocyte negative.  WBCs 0-5 RBCs 0-5 few bacteria.   Will treat patient with Reglan .  Will be a short course until she  can be seen by her new OB/GYN group.  Patient without any urinary tract infections we will just send urine for culture.  Will check fetal heart tones.  Fetal heart tones were 147 very reassuring.  Patient stable for discharge.  Final diagnoses:  Migraine without aura and without status migrainosus, not intractable  [redacted] weeks gestation of pregnancy    ED  Discharge Orders     None          Vici Novick, MD 02/18/24 1557    Geraldene Hamilton, MD 02/18/24 [phone]

## 2024-02-18 NOTE — ED Triage Notes (Signed)
 Pt reports migraine since last night.  Took tylenol  appx 0530, no relief.   Currently G2P1- currently 24 weeks- felt baby move today, denies known pregnancy complications.

## 2024-02-18 NOTE — ED Notes (Signed)
 Pt alert and oriented X 4 at the time of discharge. RR even and unlabored. No acute distress noted. Pt verbalized understanding of discharge instructions as discussed. Pt ambulatory to lobby at time of discharge.

## 2024-02-19 LAB — URINE CULTURE: Culture: <10000 — AB

## 2024-02-26 ENCOUNTER — Ambulatory Visit: Attending: Maternal & Fetal Medicine

## 2024-02-26 ENCOUNTER — Ambulatory Visit (HOSPITAL_BASED_OUTPATIENT_CLINIC_OR_DEPARTMENT_OTHER)

## 2024-02-26 ENCOUNTER — Ambulatory Visit

## 2024-02-26 ENCOUNTER — Other Ambulatory Visit: Payer: Self-pay

## 2024-02-26 ENCOUNTER — Ambulatory Visit (HOSPITAL_BASED_OUTPATIENT_CLINIC_OR_DEPARTMENT_OTHER): Admitting: Maternal & Fetal Medicine

## 2024-02-26 VITALS — BP 113/71 | HR 95 | Temp 98.0°F

## 2024-02-26 DIAGNOSIS — O35EXX0 Maternal care for other (suspected) fetal abnormality and damage, fetal genitourinary anomalies, not applicable or unspecified: Secondary | ICD-10-CM

## 2024-02-26 DIAGNOSIS — Z363 Encounter for antenatal screening for malformations: Secondary | ICD-10-CM | POA: Diagnosis present

## 2024-02-26 DIAGNOSIS — O09292 Supervision of pregnancy with other poor reproductive or obstetric history, second trimester: Secondary | ICD-10-CM | POA: Diagnosis not present

## 2024-02-26 DIAGNOSIS — Z3A25 25 weeks gestation of pregnancy: Secondary | ICD-10-CM | POA: Diagnosis not present

## 2024-02-26 DIAGNOSIS — O09299 Supervision of pregnancy with other poor reproductive or obstetric history, unspecified trimester: Secondary | ICD-10-CM

## 2024-02-26 DIAGNOSIS — O99212 Obesity complicating pregnancy, second trimester: Secondary | ICD-10-CM

## 2024-02-26 DIAGNOSIS — O09212 Supervision of pregnancy with history of pre-term labor, second trimester: Secondary | ICD-10-CM | POA: Insufficient documentation

## 2024-02-26 DIAGNOSIS — E669 Obesity, unspecified: Secondary | ICD-10-CM | POA: Diagnosis not present

## 2024-02-26 DIAGNOSIS — O09899 Supervision of other high risk pregnancies, unspecified trimester: Secondary | ICD-10-CM

## 2024-02-26 DIAGNOSIS — Z8489 Family history of other specified conditions: Secondary | ICD-10-CM

## 2024-02-26 DIAGNOSIS — O0990 Supervision of high risk pregnancy, unspecified, unspecified trimester: Secondary | ICD-10-CM

## 2024-02-26 DIAGNOSIS — Z6836 Body mass index (BMI) 36.0-36.9, adult: Secondary | ICD-10-CM

## 2024-02-26 NOTE — Progress Notes (Signed)
 In-Person Genetic Counseling Clinic Note:   Length of Consultation: 15 minutes  Ms. 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  The patient was present at this visit with her partner.  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  She was diagnosed and followed by medical genetics at [facility], though she has not recently been back for follow up.  Chromosome deletions may be inherited from a parent who has the same deletion or who has another chromosome rearrangement, or these deletions may also occur as a new change (de novo) in a child.  To assess this, testing was performed on Concetta and on her prior partner.  They both were found to not carry the chromosome difference identified in Adeline.  Therefore, it was estimated that the chance for Monda to have another child with this deletion would be less than 1%. This small possibility is due to the chance that Shir could have this change in some of her egg cells.  Children with [phone] deletions often have developmental differences, growth delays, low muscle tone, vision and hearing problems.  We reviewed the risks, benefits and limitations of amniocentesis as an option of assess this pregnancy for chromosome differences. Tashiba declined this testing and prefers to wait until after delivery. We will add her to the [facility] list to ensure our team is aware of this possible testing after birth.  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  A VUS is a variant of uncertain significance, meaning that the lab was able to identify a change in the gene, but that there is not enough data at this time to determine  if the change is expected to cause any health concerns.  Per the notes from Dr. Bambi at [facility], she was not concerned about this gene being of significance because Danyeal has had no vision/eye concerns.  If that changes, we would encourage her to speak again with a Medical Geneticist who specializes in adult genetics.  Also, the understanding of genetic variants often changes with time, so she could consider revisiting with condition with Adeline's doctors in the future.   Family history and pregnancy history: We inquired about the family history and pregnancy history for the patient and her current partner.  They reported that the remainder of the family history is unremarkable for birth defects, intellectual delays, recurrent pregnancy loss or known chromosome abnormalities. In the current pregnancy, she reported no complications or exposure to medications, alcohol, tobacco or recreational drugs.   Previous Testing Completed: Low risk NIPS:  Shivani reported that she previously completed noninvasive prenatal screening (NIPS) in this pregnancy. She stated that the results is low risk, consistent with a female fetus. However, we do not have documentation of this result.  We did review that low risk NIPS results significantly reduces but does not eliminate the chance that the current pregnancy has Down syndrome (trisomy 20), trisomy 77, trisomy 15, and common sex chromosome conditions, and 22q11.2 microdeletion syndrome.  This type of testing does NOT assess for the chromosome 14 deletion seen in Adeline.  Please see report for details.   Negative ms-AFP screening:  Desire previously completed a maternal serum AFP  screen in this pregnancy. The result is screen negative. Please see report for details. A negative result reduces the risk that the current pregnancy has an open neural tube defect. Closed neural tube defects and some open defects may not be detected by this screen.  Carrier screening. Per  the ACOG Committee Opinion 691, all women who are considering a pregnancy or are currently pregnant should be offered carrier screening for, at minimum, Cystic Fibrosis (CF), Spinal Muscular Atrophy (SMA), and Hemoglobinopathies The mode of inheritance, clinical manifestations of these conditions, as well as details about testing were reviewed. A negative result on carrier screening reduces the likelihood of being a carrier, however, does not entirely rule out the possibility. If Rabecca was found to be a carrier for a specific condition, carrier screening for their reproductive partner would be recommended. We did not have results of this carrier screening for Ramandeep.  She did have hemoglobin fractionation which showed normal AA hemoglobin and an MCV of 92.7.  This greatly reduces the chance that she is a carrier for beta hemoglobinopathy.   Plan of Care: Continue with normal prenatal care and ultrasounds as recommended. See today's ultrasound report. We will add Emmett to the [facility] meeting list, as she would like to plan for chromosome testing on this baby at birth.     30 minutes were spent on the date of the encounter in service to the patient including preparation, face-to-face consultation, discussion of test reports and available next steps, genetic risk assessment, documentation, and care coordination.    Thank you for sharing in the care of Cerina with us .  Please do not hesitate to contact us  at [phone] if you have any questions.   Barnie PHEBE Dixons, MS, CGC

## 2024-02-26 NOTE — Progress Notes (Signed)
 MFM Consultation  Miranda Hayes is a 23 yo G2P1 who is here at [redacted]w[redacted]d with and ED of 06/07/24. She is seen at the request of Miranda Hayes regarding history of elevated BMI, preeclampsia and prior child with a gene deletion.   She is overall doing well today without complaints. She denies PTL or s/sx of preeclampsia.   She is currently taking low dose ASA for preeclampsia prevention.  Her prior preterm delivery was late preterm at 45 w 1d she presented with preterm labor which she says she was managing the entire pregnancy since 24 weeks. The day of delivery she presented with advanced cervical dilation had spontaneous ROM and delivered uncomplicated NSVD.  She reports that during the postpartum period she developed preeclampsia. I do not have records of this diagnosis with supporting documentation or labs.   She is not taking vaginal progesterone at this time.  She met with our genetic counselor Miranda Hayes to review options for testing. She declined diagnostic testing at this time and elected for postnatal evaluation.     02/26/2024    7:47 AM 02/18/2024    3:45 PM 02/18/2024    3:30 PM  Vitals with BMI  Systolic 113  118  Diastolic 71  75  Pulse 95 85 89   OB History  Gravida Para Term Preterm AB Living  2 1 0 1 0 1  SAB IAB Ectopic Multiple Live Births  0 0 0 0 1    # Outcome Date GA Lbr Len/2nd Weight Sex Type Anes PTL Lv  2 Current           1 Preterm 09/04/19 [redacted]w[redacted]d 04:00 / 00:31 2670 g F Vag-Spont  Y LIV     Complications: Pre-eclampsia     Name: Miranda Hayes,Miranda Hayes     Apgar1: 7  Apgar5: 8    Obstetric Comments  Pre-E first pregnancy.  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 Refer MFM.   Check kidneys again at 36 weeks.     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  aspirin EC 81 MG tablet Take 81 mg by mouth daily. (Patient not taking: Reported on 02/26/2024)     Ferrous Sulfate (IRON PO) Take 1 tablet by mouth daily. (Patient not taking: Reported on 02/10/2024)     metoCLOPramide  (REGLAN ) 10 MG tablet Take 1 tablet (10 mg total) by mouth 3 (three) times daily with meals as needed for nausea. (Patient not taking: Reported on 02/10/2024) 30 tablet 0   metoCLOPramide  (REGLAN ) 10 MG tablet Take 1 tablet (10 mg total) by mouth every 6 (six) hours. (Patient not taking: Reported on 02/26/2024) 30 tablet 0   Prenatal Vit-Fe Fumarate-FA (MULTIVITAMIN-PRENATAL) 27-0.8 MG TABS tablet Take 1 tablet by mouth daily at 12 noon.     promethazine  (PHENERGAN ) 25 MG tablet Take 1 tablet (25 mg total) by mouth  at bedtime and may repeat dose one time if needed. (Patient not taking: Reported on 02/10/2024)     No current facility-administered medications on file prior to visit.   Social History   Socioeconomic History   Marital status: Single    Spouse name: Not on file   Number of children: Not on file   Years of education: Not on file   Highest education level: Not on file  Occupational History   Not on file  Tobacco Use   Smoking status: Never   Smokeless tobacco: Never  Vaping Use   Vaping status: Every Day   Substances: Nicotine  Substance and Sexual Activity   Alcohol use: Not Currently   Drug use: Not Currently    Types: Marijuana    Comment: Last used beginning of August 2025   Sexual activity: Yes    Birth control/protection: None  Other Topics Concern   Not on file  Social History Narrative   Not on file   Social Drivers of Health   Financial Resource Strain: Low Risk  (10/17/2022)   Received from [facility]   Overall Financial Resource Strain (CARDIA)    Difficulty of Paying Living Expenses: Not hard at all  Food Insecurity: No Food Insecurity (02/10/2024)   Hunger Vital Sign    Worried About Running Out of Food in the Last Year: Never true    Ran Out of Food in the Last Year: Never true  Transportation Needs: No Transportation Needs (02/10/2024)   PRAPARE - Administrator, Civil Service (Medical): No    Lack of Transportation (Non-Medical): No  Physical Activity: Insufficiently Active (02/20/2019)   Received from [facility] visits prior to 07/14/2022.   Exercise Vital Sign    On average, how many days per week do you engage in moderate to strenuous exercise (like a brisk walk)?: 1 day    On average, how many minutes do you engage in exercise at this level?: 30 min  Stress: No Stress Concern Present (12/13/2021)   Received from [facility] - Occupational Stress Questionnaire    Feeling of  Stress : Not at all  Social Connections: Unknown (09/22/2021)   Received from [facility]   Social Network    Social Network: Not on file  Intimate Partner Violence: Unknown (08/15/2021)   Received from [facility]   HITS    Physically Hurt: Not on file    Insult or Talk Down To: Not on file    Threaten Physical Harm: Not on file    Scream or Curse: Not on file   Current Outpatient Medications on File Prior to  Visit  Medication Sig Dispense Refill   acetaminophen  (TYLENOL ) 325 MG tablet Take 650 mg by mouth every 6 (six) hours as needed.     aspirin EC 81 MG tablet Take 81 mg by mouth daily. (Patient not taking: Reported on 02/26/2024)     Ferrous Sulfate (IRON PO) Take 1 tablet by mouth daily. (Patient not taking: Reported on 02/10/2024)     metoCLOPramide  (REGLAN ) 10 MG tablet Take 1 tablet (10 mg total) by mouth 3 (three) times daily with meals as needed for nausea. (Patient not taking: Reported on 02/10/2024) 30 tablet 0   metoCLOPramide  (REGLAN ) 10 MG tablet Take 1 tablet (10 mg total) by mouth every 6 (six) hours. (Patient not taking: Reported on 02/26/2024) 30 tablet 0   Prenatal Vit-Fe Fumarate-FA (MULTIVITAMIN-PRENATAL) 27-0.8 MG TABS tablet Take 1 tablet by mouth daily at 12 noon.     promethazine  (PHENERGAN ) 25 MG tablet Take 1 tablet (25 mg total) by mouth at bedtime and may repeat dose one time if needed. (Patient not taking: Reported on 02/10/2024)     No current facility-administered medications on file prior to visit.   Allergies  Allergen Reactions   Latex Dermatitis and Itching   Imaging: SIUP with measurements consistent with dates No markers of aneuploidy observed today Good fetal movement and amniotic fluid volume Suboptimal views of the fetal anatomy were obtained secondary to fetal position and maternal habitus.  Impression/Counseling:  I discussed today's findings and with Ms. 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 She declined any further testing at this time and desires to have postnatal evaluation.  Secondly we discussed the TWG goal of 11-20 lbs and associated risk for preeclampsia, GDM, fetal macrosomia and cesarean delivery with possible postnatal thrombosis and or infection.  We recommend a growth exam in the third trimester   Thirdly, I reviewed the diagnosis and management of preeclampsia. It is uncertain the true context of her diagnosis postpartum as the notes I read in 2021 had normal BP and there were no Labs evaluating preeclampsia.  Nevertheless I recommend routine care and reviewed s/sx of preeclampsia.   Lastly, we discussed the limited knowledge regarding late preterm delivery. We discussed the potential limited role of vaginal progesterone in this circumstance. In addition, given her gestational age cervical length surveillance and initiating progesterone therapy is not indicated.   I spent 45 minutes with > 50% in face to face consultation, documentation, image review and medical record review.  All questions answered  Miranda Hayes DOROTHA Fetters, MD

## 2024-03-12 ENCOUNTER — Ambulatory Visit (INDEPENDENT_AMBULATORY_CARE_PROVIDER_SITE_OTHER): Admitting: Obstetrics & Gynecology

## 2024-03-12 ENCOUNTER — Other Ambulatory Visit: Payer: Self-pay

## 2024-03-12 ENCOUNTER — Other Ambulatory Visit

## 2024-03-12 VITALS — BP 117/83 | HR 89 | Wt 236.3 lb

## 2024-03-12 DIAGNOSIS — O09892 Supervision of other high risk pregnancies, second trimester: Secondary | ICD-10-CM

## 2024-03-12 DIAGNOSIS — O09299 Supervision of pregnancy with other poor reproductive or obstetric history, unspecified trimester: Secondary | ICD-10-CM

## 2024-03-12 DIAGNOSIS — O09292 Supervision of pregnancy with other poor reproductive or obstetric history, second trimester: Secondary | ICD-10-CM | POA: Diagnosis not present

## 2024-03-12 DIAGNOSIS — O09899 Supervision of other high risk pregnancies, unspecified trimester: Secondary | ICD-10-CM

## 2024-03-12 DIAGNOSIS — O0990 Supervision of high risk pregnancy, unspecified, unspecified trimester: Secondary | ICD-10-CM

## 2024-03-12 DIAGNOSIS — Z3A27 27 weeks gestation of pregnancy: Secondary | ICD-10-CM

## 2024-03-12 DIAGNOSIS — G43809 Other migraine, not intractable, without status migrainosus: Secondary | ICD-10-CM | POA: Diagnosis not present

## 2024-03-12 DIAGNOSIS — O0992 Supervision of high risk pregnancy, unspecified, second trimester: Secondary | ICD-10-CM

## 2024-03-12 MED ORDER — RIZATRIPTAN BENZOATE 10 MG PO TABS: 10.0000 mg | ORAL_TABLET | ORAL | 0 refills | Status: AC | PRN

## 2024-03-12 MED ORDER — PANTOPRAZOLE SODIUM 40 MG PO TBEC: 40.0000 mg | DELAYED_RELEASE_TABLET | Freq: Every day | ORAL | 3 refills | Status: AC

## 2024-03-12 NOTE — Progress Notes (Signed)
   PRENATAL VISIT NOTE  Subjective:  Miranda Hayes is a 23 y.o. G2P0101 at [redacted]w[redacted]d being seen today for ongoing prenatal care.  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  Patient reports headache and heartburn.  Contractions: Not present. Vag. Bleeding: None.  Movement: Present. Denies leaking of fluid.   The following portions of the patient's history were reviewed and updated as appropriate: allergies, current medications, past family history, past medical history, past social history, past surgical history and problem list.   Objective:    Vitals:   03/12/24 0921  BP: 117/83  Pulse: 89  Weight: 236 lb 4.8 oz (107.2 kg)    Fetal Status:  Fetal Heart Rate (bpm): 138   Movement: Present    General: Alert, oriented and cooperative. Patient is in no acute distress.  Skin: Skin is warm and dry. No rash noted.   Cardiovascular: Normal heart rate noted  Respiratory: Normal respiratory effort, no problems with respiration noted  Abdomen: Soft, gravid, appropriate for gestational age.  Pain/Pressure: Present     Pelvic: Cervical exam deferred        Extremities: Normal range of motion.  Edema: Trace  Mental Status: Normal mood and affect. Normal behavior. Normal judgment and thought content.   Assessment and Plan:  Pregnancy: G2P0101 at [redacted]w[redacted]d 1. Supervision of high risk pregnancy, antepartum (Primary) 2 hr GTT  2. History of pre-eclampsia in prior pregnancy, currently pregnant   3. History of preterm delivery, currently pregnant   Preterm labor symptoms and general obstetric precautions including but not limited to vaginal bleeding, contractions, leaking of fluid and fetal movement were reviewed in detail with the  patient. Please refer to After Visit Summary for other counseling recommendations.   No follow-ups on file.  Future Appointments  Date Time Provider Department Center  03/12/2024 10:20 AM [facility]  04/16/2024 10:15 AM [facility] PROVIDER 1 [facility]  04/16/2024 10:30 AM [facility] US2 [facility]    Lynwood Solomons, MD

## 2024-03-13 LAB — CBC
Hematocrit: 39.6 % (ref 34.0–46.6)
Hemoglobin: 13.3 g/dL (ref 11.1–15.9)
MCH: 32.8 pg (ref 26.6–33.0)
MCHC: 33.6 g/dL (ref 31.5–35.7)
MCV: 98 fL — ABNORMAL HIGH (ref 79–97)
Platelets: 210 x10E3/uL (ref 150–450)
RBC: 4.06 x10E6/uL (ref 3.77–5.28)
RDW: 12.3 % (ref 11.7–15.4)
WBC: 11.7 x10E3/uL — ABNORMAL HIGH (ref 3.4–10.8)

## 2024-03-13 LAB — GLUCOSE TOLERANCE, 2 HOURS W/ 1HR
Glucose, 1 hour: 125 mg/dL (ref 70–179)
Glucose, 2 hour: 124 mg/dL (ref 70–152)
Glucose, Fasting: 72 mg/dL (ref 70–91)

## 2024-03-13 LAB — RPR: RPR Ser Ql: NONREACTIVE

## 2024-03-13 LAB — HIV ANTIBODY (ROUTINE TESTING W REFLEX): HIV Screen 4th Generation wRfx: NONREACTIVE

## 2024-03-24 ENCOUNTER — Other Ambulatory Visit: Payer: Self-pay

## 2024-03-24 ENCOUNTER — Ambulatory Visit: Admitting: Family Medicine

## 2024-03-24 ENCOUNTER — Encounter: Payer: Self-pay | Admitting: Family Medicine

## 2024-03-24 VITALS — BP 109/77 | HR 96 | Wt 240.2 lb

## 2024-03-24 DIAGNOSIS — O09893 Supervision of other high risk pregnancies, third trimester: Secondary | ICD-10-CM

## 2024-03-24 DIAGNOSIS — Z23 Encounter for immunization: Secondary | ICD-10-CM | POA: Diagnosis not present

## 2024-03-24 DIAGNOSIS — Z3A29 29 weeks gestation of pregnancy: Secondary | ICD-10-CM

## 2024-03-24 DIAGNOSIS — Z6836 Body mass index (BMI) 36.0-36.9, adult: Secondary | ICD-10-CM

## 2024-03-24 DIAGNOSIS — O0993 Supervision of high risk pregnancy, unspecified, third trimester: Secondary | ICD-10-CM

## 2024-03-24 DIAGNOSIS — O0990 Supervision of high risk pregnancy, unspecified, unspecified trimester: Secondary | ICD-10-CM

## 2024-03-24 DIAGNOSIS — O09899 Supervision of other high risk pregnancies, unspecified trimester: Secondary | ICD-10-CM

## 2024-03-24 DIAGNOSIS — O09299 Supervision of pregnancy with other poor reproductive or obstetric history, unspecified trimester: Secondary | ICD-10-CM

## 2024-03-24 DIAGNOSIS — O09293 Supervision of pregnancy with other poor reproductive or obstetric history, third trimester: Secondary | ICD-10-CM

## 2024-03-24 NOTE — Patient Instructions (Signed)

## 2024-03-24 NOTE — Progress Notes (Signed)
   PRENATAL VISIT NOTE  Subjective:  Miranda Hayes is a 23 y.o. G2P0101 at [redacted]w[redacted]d being seen today for ongoing prenatal care.  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  Patient reports no complaints.  Contractions: Not present. Vag. Bleeding: None.  Movement: Present. Denies leaking of fluid.   The following portions of the patient's history were reviewed and updated as appropriate: allergies, current medications, past family history, past medical history, past social history, past surgical history and problem list.   Objective:   Vitals:   03/24/24 1650  BP: 109/77  Pulse: 96  Weight: 240 lb 3.2 oz (109 kg)    Fetal Status:  Fetal Heart Rate (bpm): 136   Movement: Present    General: Alert, oriented and cooperative. Patient is in no acute distress.  Skin: Skin is warm and dry. No rash noted.   Cardiovascular: Normal heart rate noted  Respiratory: Normal respiratory effort, no problems with respiration noted  Abdomen: Soft, gravid, appropriate for gestational age.  Pain/Pressure: Present     Pelvic: Cervical exam deferred        Extremities: Normal range of motion.  Edema: None  Mental Status: Normal mood and affect. Normal behavior. Normal judgment and thought content.      02/10/2024    3:49 PM  Depression screen PHQ 2/9  Decreased Interest 0  Down, Depressed, Hopeless 0  PHQ - 2 Score 0  Altered sleeping 0  Tired, decreased energy 2  Change in appetite 0  Feeling bad or failure about yourself  0  Trouble concentrating 0  Moving slowly or fidgety/restless 0  Suicidal thoughts 0  PHQ-9 Score 2      Data saved with a previous flowsheet row definition        02/10/2024    3:49 PM  GAD 7 :  Generalized Anxiety Score  Nervous, Anxious, on Edge 0  Control/stop worrying 0  Worry too much - different things 0  Trouble relaxing 0  Restless 0  Easily annoyed or irritable 0  Afraid - awful might happen 0  Total GAD 7 Score 0    Assessment and Plan:  Pregnancy: G2P0101 at [redacted]w[redacted]d 1. Supervision of high risk pregnancy, antepartum (Primary) BP and FHR normal Accepts tdap today Has f/u growth scheduled for 04/16/2024 Up to date on labs  2. [redacted] weeks gestation of pregnancy   3. History of pre-eclampsia in prior pregnancy, currently pregnant Continue ASA  4. BMI 36.0-36.9,adult Continue ASA  5. History of preterm delivery, currently pregnant Chart reviewed from [facility] Spontaneous labor at 36 weeks  Preterm labor symptoms and general obstetric precautions including but not limited to vaginal bleeding, contractions, leaking of fluid and fetal movement were reviewed in detail with the patient. Please refer to After Visit Summary for other counseling recommendations.   Return in about 2 weeks (around 04/07/2024) for ob visit, [facility].  Future Appointments  Date Time Provider Department Center  04/16/2024 10:15 AM [facility] PROVIDER 1 [facility]  04/16/2024 10:30 AM [facility] US2 [facility]    Donnice CHRISTELLA Carolus, MD

## 2024-03-25 ENCOUNTER — Encounter: Admitting: Physician Assistant

## 2024-04-01 ENCOUNTER — Ambulatory Visit

## 2024-04-13 ENCOUNTER — Other Ambulatory Visit: Payer: Self-pay

## 2024-04-13 ENCOUNTER — Ambulatory Visit: Admitting: Student

## 2024-04-13 VITALS — BP 115/84 | HR 120 | Wt 247.4 lb

## 2024-04-13 DIAGNOSIS — O09899 Supervision of other high risk pregnancies, unspecified trimester: Secondary | ICD-10-CM

## 2024-04-13 DIAGNOSIS — Z3A32 32 weeks gestation of pregnancy: Secondary | ICD-10-CM

## 2024-04-13 DIAGNOSIS — O09299 Supervision of pregnancy with other poor reproductive or obstetric history, unspecified trimester: Secondary | ICD-10-CM

## 2024-04-13 DIAGNOSIS — O0990 Supervision of high risk pregnancy, unspecified, unspecified trimester: Secondary | ICD-10-CM

## 2024-04-13 NOTE — Assessment & Plan Note (Addendum)
 Normotensive today.

## 2024-04-13 NOTE — Assessment & Plan Note (Addendum)
-  Denies contractions, LOF, or vaginal bleeding. Reports good fetal movement.  -Discussed upcoming appointments & reasons to go to [facility]U -Scheduled for growth/complete anatomy 12/4

## 2024-04-13 NOTE — Assessment & Plan Note (Addendum)
-  Reviewed PTL precautions

## 2024-04-13 NOTE — Progress Notes (Signed)
   PRENATAL VISIT NOTE  Subjective:  Miranda Hayes is a 23 y.o. G2P0101 at [redacted]w[redacted]d being seen today for ongoing prenatal care.  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  Patient reports no complaints.  Contractions: Not present. Vag. Bleeding: None.  Movement: Present. Denies leaking of fluid.   The following portions of the patient's history were reviewed and updated as appropriate: allergies, current medications, past family history, past medical history, past social history, past surgical history and problem list.   Objective:   Vitals:   04/13/24 1636  BP: 115/84  Pulse: (!) 120  Weight: 247 lb 6.4 oz (112.2 kg)    Fetal Status:  Fetal Heart Rate (bpm): 152 Fundal Height: 34 cm Movement: Present    General: Alert, oriented and cooperative. Patient is in no acute distress.  Skin: Skin is warm and dry. No rash noted.   Cardiovascular: Normal heart rate noted  Respiratory: Normal respiratory effort, no problems with respiration noted  Abdomen: Soft, gravid, appropriate for gestational age.  Pain/Pressure: Present     Pelvic: Cervical exam deferred        Extremities: Normal range of motion.  Edema: Trace  Mental Status: Normal mood and affect. Normal behavior. Normal judgment and thought content.      02/10/2024    3:49 PM  Depression screen PHQ 2/9  Decreased Interest 0  Down, Depressed, Hopeless 0  PHQ - 2 Score 0  Altered sleeping 0  Tired, decreased energy 2  Change in appetite 0  Feeling bad or failure about yourself  0  Trouble concentrating 0  Moving slowly or fidgety/restless 0  Suicidal thoughts 0  PHQ-9 Score 2      Data saved with a previous flowsheet row definition         02/10/2024    3:49 PM  GAD 7 : Generalized Anxiety Score  Nervous, Anxious, on Edge 0  Control/stop worrying 0  Worry too much - different things 0  Trouble relaxing 0  Restless 0  Easily annoyed or irritable 0  Afraid - awful might happen 0  Total GAD 7 Score 0    Assessment and Plan:  Pregnancy: G2P0101 at [redacted]w[redacted]d  Assessment & Plan Supervision of high risk pregnancy, antepartum -Denies contractions, LOF, or vaginal bleeding. Reports good fetal movement.  -Discussed upcoming appointments & reasons to go to [facility] -Scheduled for growth/complete anatomy 12/4 History of preterm delivery, currently pregnant -Reviewed PTL precautions History of pre-eclampsia in prior pregnancy, currently pregnant -Normotensive today [redacted] weeks gestation of pregnancy     Preterm labor symptoms and general obstetric precautions including but not limited to vaginal bleeding, contractions, leaking of fluid and fetal movement were reviewed in detail with the patient. Please refer to After Visit Summary for other counseling recommendations.   Return in about 2 weeks (around 04/27/2024) for Routine OB.  Future Appointments  Date Time Provider Department Center  04/16/2024 10:15 AM [facility] PROVIDER 1 [facility]  04/16/2024 10:30 AM [facility] US2 [facility]  04/27/2024  2:35 PM Cresenzo, Norleen GAILS, MD [facility]    Rocky Satterfield, NP

## 2024-04-16 ENCOUNTER — Telehealth: Payer: Self-pay | Admitting: Obstetrics

## 2024-04-16 ENCOUNTER — Ambulatory Visit

## 2024-04-17 ENCOUNTER — Other Ambulatory Visit: Payer: Self-pay | Admitting: Maternal & Fetal Medicine

## 2024-04-17 ENCOUNTER — Ambulatory Visit

## 2024-04-17 ENCOUNTER — Ambulatory Visit: Attending: Obstetrics and Gynecology

## 2024-04-17 ENCOUNTER — Encounter: Payer: Self-pay | Admitting: Maternal & Fetal Medicine

## 2024-04-17 VITALS — BP 113/67 | HR 102

## 2024-04-17 DIAGNOSIS — O368130 Decreased fetal movements, third trimester, not applicable or unspecified: Secondary | ICD-10-CM

## 2024-04-17 DIAGNOSIS — O09299 Supervision of pregnancy with other poor reproductive or obstetric history, unspecified trimester: Secondary | ICD-10-CM | POA: Diagnosis not present

## 2024-04-17 DIAGNOSIS — O99212 Obesity complicating pregnancy, second trimester: Secondary | ICD-10-CM

## 2024-04-17 DIAGNOSIS — O35EXX0 Maternal care for other (suspected) fetal abnormality and damage, fetal genitourinary anomalies, not applicable or unspecified: Secondary | ICD-10-CM | POA: Diagnosis not present

## 2024-04-17 DIAGNOSIS — O09899 Supervision of other high risk pregnancies, unspecified trimester: Secondary | ICD-10-CM

## 2024-04-17 DIAGNOSIS — Z3A32 32 weeks gestation of pregnancy: Secondary | ICD-10-CM | POA: Diagnosis not present

## 2024-04-17 NOTE — Progress Notes (Signed)
 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 G2P0101 at [redacted]w[redacted]d here for ultrasound and consultation. Miranda Hayes is doing well today with no acute concerns. Today we focused on the following:   Patient is here for growth ultrasound due to history of preeclampsia.  She reports that she has a difficult time assessing fetal movement.  Even when the fetus was moving on the ultrasound she had a difficult time perceiving it consistently.  I discussed that there is no signs of placental insufficiency given the normal biophysical profile and fetal growth.  I discussed that we could do either weekly testing or growth ultrasound in 5 weeks.  The patient was reassured by today's ultrasound and given the absence of indications for weekly testing she elected to have a growth ultrasound in 5 weeks.  She was encouraged to continue to monitor for fetal movement and to go to the MAU or the hospital with any concerns.  Today's ultrasounds shows normal fetal growth with an estimated fetal weight at the 70 percentile overall and normal amniotic fluid. There was also appropriate fetal movement and tone for this gestational age. BPP was 8/8.  The abdominal circumference is at the 94th percentile overall.  I instructed the patient to be mindful of her carbohydrate and sugar intake since fetuses can elicit a growth response with excessive carbohydrates and sugars.  The patient had time to ask questions that  were answered to her satisfaction.  She verbalized understanding and agrees to proceed with the plan below.  Standard OB precautions were given to the patient when appropriate based on the gestational age (fetal kick counts, importance of attending prenatal visits, any antenatal testing or ultrasounds that are scheduled as well as monitoring for signs and symptoms that is labor, vaginal bleeding, loss of amniotic fluid, or decreased fetal movement).  Recommendations - Growth ultrasound in 5 weeks - Fetal movement precautions given  I spent 30 minutes reviewing the patients chart, including labs and images as well as counseling the patient about her medical conditions. Greater than 50% of the time was spent in direct face-to-face patient counseling.  Delora Smaller  MFM, [place]   04/17/2024  9:12 AM   Review of Systems: A review of systems was performed and was negative except per HPI   Vitals and Physical Exam    04/17/2024    8:21 AM 04/13/2024    4:36 PM 03/24/2024    4:50 PM  Vitals with BMI  Weight  247 lbs 6 oz 240 lbs 3 oz  Systolic 113 115 890  Diastolic 67 84 77  Pulse 102 120 96    Sitting comfortably on the sonogram table Nonlabored breathing Normal rate and rhythm Abdomen is nontender  Past pregnancies OB History  Gravida Para Term Preterm AB Living  2 1 0 1 0 1  SAB IAB Ectopic Multiple Live Births  0 0 0  1    # Outcome Date GA Lbr Len/2nd Weight Sex Type Anes  PTL Lv  2 Current           1 Preterm 09/04/19 [redacted]w[redacted]d 04:00 / 00:31 5 lb 14.2 oz (2.67 kg) F Vag-Spont  Y LIV     Complications: Pre-eclampsia    Obstetric Comments  Pre-E first pregnancy.  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     Future Appointments  Date Time Provider Department Center  04/27/2024  2:35 PM Ilean, Norleen GAILS, MD [facility]

## 2024-04-27 ENCOUNTER — Other Ambulatory Visit: Payer: Self-pay

## 2024-04-27 ENCOUNTER — Encounter: Payer: Self-pay | Admitting: Family Medicine

## 2024-04-27 ENCOUNTER — Ambulatory Visit: Admitting: Family Medicine

## 2024-04-27 VITALS — BP 103/71 | HR 116 | Wt 249.1 lb

## 2024-04-27 DIAGNOSIS — O0990 Supervision of high risk pregnancy, unspecified, unspecified trimester: Secondary | ICD-10-CM

## 2024-04-27 DIAGNOSIS — O0993 Supervision of high risk pregnancy, unspecified, third trimester: Secondary | ICD-10-CM

## 2024-04-27 DIAGNOSIS — Z6836 Body mass index (BMI) 36.0-36.9, adult: Secondary | ICD-10-CM

## 2024-04-27 DIAGNOSIS — O09293 Supervision of pregnancy with other poor reproductive or obstetric history, third trimester: Secondary | ICD-10-CM | POA: Diagnosis not present

## 2024-04-27 DIAGNOSIS — Z3A34 34 weeks gestation of pregnancy: Secondary | ICD-10-CM

## 2024-04-27 DIAGNOSIS — O09899 Supervision of other high risk pregnancies, unspecified trimester: Secondary | ICD-10-CM

## 2024-04-27 DIAGNOSIS — O09893 Supervision of other high risk pregnancies, third trimester: Secondary | ICD-10-CM

## 2024-04-27 DIAGNOSIS — O09299 Supervision of pregnancy with other poor reproductive or obstetric history, unspecified trimester: Secondary | ICD-10-CM

## 2024-04-27 NOTE — Progress Notes (Unsigned)
° °  PRENATAL VISIT NOTE  Subjective:  Miranda Hayes is a 23 y.o. G2P0101 at [redacted]w[redacted]d being seen today for ongoing prenatal care.  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  Patient reports {sx:14538}.  Contractions: Irritability. Vag. Bleeding: None.  Movement: Present. Denies leaking of fluid.   The following portions of the patient's history were reviewed and updated as appropriate: allergies, current medications, past family history, past medical history, past social history, past surgical history and problem list.   Objective:   Vitals:   04/27/24 1432  BP: 103/71  Pulse: (!) 116  Weight: 249 lb 1.6 oz (113 kg)    Fetal Status:  Fetal Heart Rate (bpm): 136   Movement: Present    General: Alert, oriented and cooperative. Patient is in no acute distress.  Skin: Skin is warm and dry. No rash noted.   Cardiovascular: Normal heart rate noted  Respiratory: Normal respiratory effort, no problems with respiration noted  Abdomen: Soft, gravid, appropriate for gestational age.  Pain/Pressure: Present (Pelvic)     Pelvic: {Blank single:19197::Cervical exam performed in the presence of a chaperone,Cervical exam deferred}        Extremities: Normal range of motion.  Edema: Trace  Mental Status: Normal mood and affect. Normal behavior. Normal judgment and thought content.      02/10/2024    3:49 PM  Depression screen PHQ 2/9  Decreased Interest 0  Down, Depressed, Hopeless 0  PHQ - 2 Score 0  Altered sleeping 0  Tired, decreased energy 2  Change in appetite 0  Feeling bad or failure about yourself  0  Trouble concentrating 0  Moving slowly or fidgety/restless 0  Suicidal  thoughts 0  PHQ-9 Score 2      Data saved with a previous flowsheet row definition        02/10/2024    3:49 PM  GAD 7 : Generalized Anxiety Score  Nervous, Anxious, on Edge 0  Control/stop worrying 0  Worry too much - different things 0  Trouble relaxing 0  Restless 0  Easily annoyed or irritable 0  Afraid - awful might happen 0  Total GAD 7 Score 0    Assessment and Plan:  Pregnancy: G2P0101 at [redacted]w[redacted]d 1. Supervision of high risk pregnancy, antepartum (Primary) ***  2. History of preterm delivery, currently pregnant ***  3. History of pre-eclampsia in prior pregnancy, currently pregnant ***  4. BMI 36.0-36.9,adult ***  5. [redacted] weeks gestation of pregnancy ***  {Blank single:19197::Term,Preterm} labor symptoms and general obstetric precautions including but not limited to vaginal bleeding, contractions, leaking of fluid and fetal movement were reviewed in detail with the patient. Please refer to After Visit Summary for other counseling recommendations.   No follow-ups on file.  Future Appointments  Date Time Provider Department Center  05/22/2024  2:15 PM [facility] PROVIDER 1 [facility]  05/22/2024  2:30 PM [facility] US1 [facility]    Norleen LULLA Rover, MD

## 2024-05-12 ENCOUNTER — Other Ambulatory Visit (HOSPITAL_COMMUNITY)
Admission: RE | Admit: 2024-05-12 | Discharge: 2024-05-12 | Disposition: A | Discharge status: Home / Self Care | Source: Ambulatory Visit | Attending: Obstetrics and Gynecology | Admitting: Obstetrics and Gynecology

## 2024-05-12 ENCOUNTER — Other Ambulatory Visit: Payer: Self-pay

## 2024-05-12 ENCOUNTER — Ambulatory Visit (INDEPENDENT_AMBULATORY_CARE_PROVIDER_SITE_OTHER): Payer: Self-pay | Admitting: Obstetrics and Gynecology

## 2024-05-12 VITALS — BP 119/78 | HR 118 | Wt 253.1 lb

## 2024-05-12 DIAGNOSIS — O0990 Supervision of high risk pregnancy, unspecified, unspecified trimester: Secondary | ICD-10-CM | POA: Diagnosis present

## 2024-05-12 DIAGNOSIS — O0993 Supervision of high risk pregnancy, unspecified, third trimester: Secondary | ICD-10-CM | POA: Diagnosis not present

## 2024-05-12 DIAGNOSIS — O09899 Supervision of other high risk pregnancies, unspecified trimester: Secondary | ICD-10-CM

## 2024-05-12 DIAGNOSIS — O09293 Supervision of pregnancy with other poor reproductive or obstetric history, third trimester: Secondary | ICD-10-CM | POA: Diagnosis not present

## 2024-05-12 DIAGNOSIS — Z3A36 36 weeks gestation of pregnancy: Secondary | ICD-10-CM | POA: Diagnosis not present

## 2024-05-12 DIAGNOSIS — O09299 Supervision of pregnancy with other poor reproductive or obstetric history, unspecified trimester: Secondary | ICD-10-CM

## 2024-05-12 DIAGNOSIS — O35EXX0 Maternal care for other (suspected) fetal abnormality and damage, fetal genitourinary anomalies, not applicable or unspecified: Secondary | ICD-10-CM

## 2024-05-12 DIAGNOSIS — O09893 Supervision of other high risk pregnancies, third trimester: Secondary | ICD-10-CM | POA: Diagnosis not present

## 2024-05-12 DIAGNOSIS — R300 Dysuria: Secondary | ICD-10-CM | POA: Diagnosis not present

## 2024-05-12 DIAGNOSIS — Z6839 Body mass index (BMI) 39.0-39.9, adult: Secondary | ICD-10-CM | POA: Insufficient documentation

## 2024-05-12 NOTE — Progress Notes (Signed)
" ° °  PRENATAL VISIT NOTE  Subjective:  Miranda Hayes is a 23 y.o. G2P0101 at [redacted]w[redacted]d being seen today for ongoing prenatal care.  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  Patient doing well with no acute concerns today. She reports vaginal irritation.  Contractions: Regular (with some painful contractions on december 26-27th). Vag. Bleeding: None.  Movement: Present. Denies leaking of fluid.   The following portions of the patient's history were reviewed and updated as appropriate: allergies, current medications, past family history, past medical history, past social history, past surgical history and problem list. Problem list updated.  Objective:   Vitals:   05/12/24 1320  BP: 119/78  Pulse: (!) 118  Weight: 253 lb 1.6 oz (114.8 kg)    Fetal Status: Fetal Heart Rate (bpm): 158 Fundal Height: 36 cm Movement: Present     General:  Alert, oriented and cooperative. Patient is in no acute distress.  Skin: Skin is warm and dry. No rash noted.   Cardiovascular: Normal heart rate noted  Respiratory: Normal respiratory effort, no problems with respiration noted  Abdomen: Soft, gravid, appropriate for gestational age.  Pain/Pressure: Present (sidespain level 6 and bottom of abdomen level 9 pain bottom)     Pelvic: Cervical exam performed Dilation: Fingertip Effacement (%): 20 Station: Ballotable  Extremities: Normal range of motion.  Edema: Mild pitting, slight indentation  Mental Status:  Normal mood and affect. Normal behavior. Normal judgment and thought content.   Assessment and Plan:  Pregnancy: G2P0101 at [redacted]w[redacted]d  1. [redacted] weeks gestation of pregnancy (Primary)   2. Past Pregnancy Chromosome  14 deletion Testing post delivery  3. Supervision of high risk pregnancy, antepartum Continue routine prenatal care - Culture, beta strep (group b only) - Cervicovaginal ancillary only  4. History of preterm delivery, currently pregnant No s/sx of preterm labor  5. History of pre-eclampsia in prior pregnancy, currently pregnant Normal BP  6. BMI 39.0-39.9,adult   Preterm labor symptoms and general obstetric precautions including but not limited to vaginal bleeding, contractions, leaking of fluid and fetal movement were reviewed in detail with the patient.  Please refer to After Visit Summary for other counseling recommendations.   Return in about 1 week (around 05/19/2024) for ROB, in person.   Jerilynn Buddle, MD Faculty Attending Center for [facility]   "

## 2024-05-13 LAB — CERVICOVAGINAL ANCILLARY ONLY
Bacterial Vaginitis (gardnerella): NEGATIVE
Candida Glabrata: NEGATIVE
Candida Vaginitis: NEGATIVE
Chlamydia: NEGATIVE
Comment: NEGATIVE
Comment: NEGATIVE
Comment: NEGATIVE
Comment: NEGATIVE
Comment: NEGATIVE
Comment: NORMAL
Neisseria Gonorrhea: NEGATIVE
Trichomonas: NEGATIVE

## 2024-05-13 LAB — POCT URINALYSIS DIP (DEVICE)
Bilirubin Urine: NEGATIVE
Glucose, UA: NEGATIVE mg/dL
Ketones, ur: NEGATIVE mg/dL
Leukocytes,Ua: NEGATIVE
Nitrite: NEGATIVE
Protein, ur: NEGATIVE mg/dL
Specific Gravity, Urine: 1.02 (ref 1.005–1.030)
Urobilinogen, UA: 0.2 mg/dL (ref 0.0–1.0)
pH: 7 (ref 5.0–8.0)

## 2024-05-14 LAB — URINE CULTURE

## 2024-05-14 NOTE — L&D Delivery Note (Signed)
 OB/GYN Faculty Practice Delivery Note  Miranda Hayes is a 24 y.o. G2P0101 s/p NVD at [redacted]w[redacted]d. She was admitted for eIOL as a multip with favorable cervix.   ROM: 2h 31m with clear fluid GBS Status: Positive/-- (12/30 1459)  Maximum Maternal Temperature: 97.7  Labor Progress: Initial SVE: 3.5/60/-1. Pitocin  and Cytotec . She then progressed to complete.   Delivery Date/Time: 06/02/2024 6:23 PM Delivery: Called to room and infant had been delivered by bedside RN. Head delivered right occiput posterior. nuchal cord absent. Shoulder and body delivered in usual fashion per RN report. The umbilical cord was still attached. Infant with spontaneous cry, placed on mother's abdomen, dried and stimulated. Cord clamped x 2 after 1-minute delay, and cut by father. Cord blood collected . Placenta delivered spontaneously with gentle cord traction. Clots on uterine surface noted. Fundus firm with massage and Pitocin . Labia, perineum, vagina, and cervix were inspected and found to be intact.  Placenta:  spontaneous Intact Placenta to L&D Complications:none Lacerations: none EBL: Analgesia: Epidural  Newborn Data:  Living status:Living Gender:Female Apgars:7 ,9  Weight:3930 g           Leafy Scriver, DO PGY1, [facility]   Attestation of Supervision of Resident: Evaluation and management procedures were performed by the learners: Family Medicine Resident under my supervision. I was immediately available for direct supervision, assistance and direction throughout this encounter.  I also confirm that I have verified the information documented in the residents note, and that I have also personally reperformed the pertinent components of the physical exam and all of the medical decision making activities.  I have also made any necessary editorial changes.  Barabara Maier, DO FM-OB Fellow Center for [employer]

## 2024-05-15 ENCOUNTER — Ambulatory Visit: Payer: Self-pay | Admitting: Obstetrics and Gynecology

## 2024-05-15 DIAGNOSIS — O99820 Streptococcus B carrier state complicating pregnancy: Secondary | ICD-10-CM | POA: Insufficient documentation

## 2024-05-15 LAB — CULTURE, BETA STREP (GROUP B ONLY): Strep Gp B Culture: POSITIVE — AB

## 2024-05-20 ENCOUNTER — Telehealth: Payer: Self-pay | Admitting: Obstetrics and Gynecology

## 2024-05-20 ENCOUNTER — Encounter (HOSPITAL_COMMUNITY): Payer: Self-pay | Admitting: Obstetrics & Gynecology

## 2024-05-20 ENCOUNTER — Inpatient Hospital Stay (HOSPITAL_COMMUNITY)
Admission: AD | Admit: 2024-05-20 | Discharge: 2024-05-20 | Disposition: A | Discharge status: Home / Self Care | Attending: Obstetrics & Gynecology | Admitting: Obstetrics & Gynecology

## 2024-05-20 ENCOUNTER — Other Ambulatory Visit: Payer: Self-pay

## 2024-05-20 DIAGNOSIS — Z3A37 37 weeks gestation of pregnancy: Secondary | ICD-10-CM

## 2024-05-20 DIAGNOSIS — O09293 Supervision of pregnancy with other poor reproductive or obstetric history, third trimester: Secondary | ICD-10-CM | POA: Diagnosis not present

## 2024-05-20 DIAGNOSIS — F419 Anxiety disorder, unspecified: Secondary | ICD-10-CM | POA: Diagnosis not present

## 2024-05-20 DIAGNOSIS — O99820 Streptococcus B carrier state complicating pregnancy: Secondary | ICD-10-CM | POA: Diagnosis not present

## 2024-05-20 DIAGNOSIS — O99213 Obesity complicating pregnancy, third trimester: Secondary | ICD-10-CM | POA: Insufficient documentation

## 2024-05-20 DIAGNOSIS — O368130 Decreased fetal movements, third trimester, not applicable or unspecified: Secondary | ICD-10-CM | POA: Diagnosis present

## 2024-05-20 DIAGNOSIS — O99343 Other mental disorders complicating pregnancy, third trimester: Secondary | ICD-10-CM | POA: Diagnosis not present

## 2024-05-20 DIAGNOSIS — Z3689 Encounter for other specified antenatal screening: Secondary | ICD-10-CM | POA: Diagnosis not present

## 2024-05-20 LAB — URINALYSIS, ROUTINE W REFLEX MICROSCOPIC
Bilirubin Urine: NEGATIVE
Glucose, UA: NEGATIVE mg/dL
Hgb urine dipstick: NEGATIVE
Ketones, ur: NEGATIVE mg/dL
Nitrite: NEGATIVE
Protein, ur: NEGATIVE mg/dL
Specific Gravity, Urine: 1.012 (ref 1.005–1.030)
pH: 6 (ref 5.0–8.0)

## 2024-05-20 NOTE — MAU Provider Note (Signed)
 Chief Complaint:  Decreased Fetal Movement   HPI    Miranda Hayes is a 24 y.o. G2P0101 at [redacted]w[redacted]d who presents to maternity admissions reporting patient presents to MAU at 37 weeks and 3 days reporting she has not felt baby move since around 7:38 AM.  She denies vaginal bleeding, leaking of fluid reports some abdominal cramping and contractions that radiates to her back.  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 Refer MFM.   Check kidneys again at 36 weeks.     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   Past Surgical History:  Procedure Laterality Date   CHOLECYSTECTOMY  2022   TONGUE SURGERY     Cyst removal at 24 years old   Family History  Problem Relation Age of Onset   Healthy Mother    Healthy Father    Social History[1] Allergies[2] No medications prior to admission.    I have reviewed patient's Past Medical Hx, Surgical Hx, Family Hx, Social Hx, medications and allergies.   ROS  Pertinent items noted in HPI and remainder of comprehensive ROS otherwise negative.   PHYSICAL EXAM  Patient Vitals for the past 24 hrs:  BP Temp Temp src Pulse Resp SpO2 Height Weight  05/20/24 1702 129/86 -- -- 98 -- -- -- --  05/20/24 1611 139/85 -- -- 98 -- 97 % -- --  05/20/24 1554 131/83 98.2 F (36.8 C) Oral (!) 115 17 97 % -- --  05/20/24 1545 -- -- -- -- -- -- 5' 6 (1.676 m) 115 kg    Constitutional: Well-developed, obese female in no acute distress.  Cardiovascular: normal rate & rhythm, warm and well-perfused Respiratory: normal  effort, no problems with respiration noted GI: Abd soft, limited exam d/t increased body habitus MS: Extremities nontender, no edema, normal ROM Neurologic: Alert and oriented x 4.  GU: no CVA tenderness B/L Pelvic: Declined by patient     Fetal Tracing: Reactive Baseline: 140 Variability: moderate  Accelerations: present Decelerations: absent Toco: UI   Labs: Results for orders placed or performed during the hospital encounter of 05/20/24 (from the past 24 hours)  Urinalysis, Routine w reflex microscopic -Urine, Clean Catch     Status: Abnormal   Collection Time: 05/20/24  4:03 PM  Result Value Ref Range   Color, Urine YELLOW YELLOW   APPearance HAZY (A) CLEAR   Specific Gravity, Urine 1.012 1.005  - 1.030   pH 6.0 5.0 - 8.0   Glucose, UA NEGATIVE NEGATIVE mg/dL   Hgb urine dipstick NEGATIVE NEGATIVE   Bilirubin Urine NEGATIVE NEGATIVE   Ketones, ur NEGATIVE NEGATIVE mg/dL   Protein, ur NEGATIVE NEGATIVE mg/dL   Nitrite NEGATIVE NEGATIVE   Leukocytes,Ua MODERATE (A) NEGATIVE   RBC / HPF 0-5 0 - 5 RBC/hpf   WBC, UA 0-5 0 - 5 WBC/hpf   Bacteria, UA RARE (A) NONE SEEN   Squamous Epithelial / HPF 11-20 0 - 5 /HPF   Mucus PRESENT     BSUS Pt informed that the ultrasound is considered a limited OB ultrasound and is not intended to be a complete ultrasound exam.  Patient also informed that the ultrasound is not being completed with the intent of assessing for fetal or placental anomalies or any pelvic abnormalities.  Explained that the purpose of todays ultrasound is to assess for  maternal reassurance  and presentation.  Patient acknowledges the purpose of the exam and the limitations of the study.   Baby is in VTX presentation with AAFV observed and good FM's observed visually and acknowledged by the patient.   MDM & MAU COURSE  MDM:  MODERATE  Prenatal chart reviewed Physical exam performed  NST reactive  Bedside ultrasound performed : Patient acknowledges fetal movements via bedside ultrasound.  Fetus is in vertex presentation, adequate amniotic fluid visualized, good fetal movements visualized.   Fetal kick counts reviewed with patient  UA ordered due to complaints of lower back pain versus contractions-no evidence of UTI on UA likely contaminated sample  Plan for discharge with precautions and  OB F/U as scheduled   MAU Course: Orders Placed This Encounter  Procedures   Urinalysis, Routine w reflex microscopic -Urine, Clean Catch   Discharge patient Discharge disposition: 01-Home or Self Care; Discharge patient date: 05/20/2024     ASSESSMENT   1. Decreased fetal movements in third trimester, single or unspecified fetus   2. [redacted] weeks gestation of pregnancy   3.  NST (non-stress test) reactive on fetal surveillance   4. Morbid obesity with body mass index (BMI) of 40.0 or higher (HCC)     PLAN  Discharge home in stable condition with return precautions.   Fetal kick counts reviewed  BSUS with good FM's observed and AAFV by myself and acknowledged by patient    See AVS for full description of information given to the patient including both verbal and written. Patient verbalized understanding and agrees with the plan as described above.     Follow-up Information     Center for [facility] Follow up.   Specialty: Obstetrics and Gynecology Why: If symptoms worsen or fail to resolve, As scheduled for ongoing prenatal  care Contact information: [address] [place]  [address] [phone]                Allergies as of 05/20/2024       Reactions   Latex Dermatitis, Itching        Medication List     TAKE these medications    acetaminophen  325 MG tablet Commonly known as: TYLENOL  Take 650 mg by mouth every 6 (six) hours as needed.   aspirin EC 81 MG tablet Take 81 mg by mouth daily.   metoCLOPramide  10 MG tablet Commonly known as: REGLAN  Take 1 tablet (10 mg total) by mouth 3 (three) times daily with meals as needed for nausea.   multivitamin-prenatal 27-0.8 MG Tabs tablet Take 1 tablet by mouth daily at 12 noon.   pantoprazole  40 MG tablet Commonly known as: Protonix  Take 1 tablet (40 mg total) by mouth daily.   promethazine  25 MG tablet Commonly known as: PHENERGAN  Take 1 tablet (25 mg total) by mouth at bedtime and may repeat dose one time if needed.   rizatriptan  10 MG tablet Commonly known as: Maxalt  Take 1 tablet (10 mg total) by mouth as needed for migraine. May repeat in 2 hours if needed        Olam Dalton, MSN, [facility], Center for [employer]       [1]  Social History Tobacco Use   Smoking status:  Never   Smokeless tobacco: Never  Vaping Use   Vaping status: Every Day   Substances: Nicotine  Substance Use Topics   Alcohol use: Not Currently   Drug use: Not Currently    Types: Marijuana    Comment: Last used beginning of August 2025  [2]  Allergies Allergen Reactions   Latex Dermatitis and Itching

## 2024-05-20 NOTE — Progress Notes (Signed)
" ° ° °  TELEHEALTH OBSTETRICS VISIT ENCOUNTER NOTE  Provider location: Center for [facility]   Patient location: Home  I connected with Miranda Hayes on 05/20/2024 at  1:55 PM EST by telephone at home and verified that I am speaking with the correct person using two identifiers. Of note, unable to do video encounter due to technical difficulties.    I discussed the limitations, risks, security and privacy concerns of performing an evaluation and management service by telephone and the availability of in person appointments. I also discussed with the patient that there may be a patient responsible charge related to this service. The patient expressed understanding and agreed to proceed.  Subjective:  Miranda Hayes is a 24 y.o. G2P0101 at [redacted]w[redacted]d being followed for ongoing prenatal care.  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  Patient reports she hasn't felt baby move today. Denies any contractions, bleeding or leaking of fluid.   The following portions of the patient's history were reviewed and updated as appropriate: allergies, current medications, past family history, past medical history, past social history, past surgical history and problem list.   Objective:  Last menstrual period 09/03/2023. General:  Alert, oriented and cooperative.   Mental Status: Normal mood and affect perceived. Normal judgment and thought content.  Rest of physical exam deferred due to type of encounter  Assessment and Plan:  Pregnancy: G2P0101 at [redacted]w[redacted]d 1. Decreased fetal movements in third trimester, single or unspecified fetus  (Primary) I told her to go to her nearest hospital for evaluation. If negative eval and pt discharged to home, I told her that, in the future, if that happens, to wait an hour and if still not feeling baby move to go to the nearest hospital  2. [redacted] weeks gestation of pregnancy  I discussed the assessment and treatment plan with the patient. The patient was provided an opportunity to ask questions and all were answered. The patient agreed with the plan and demonstrated an understanding of the instructions. The patient was advised to call back or seek an in-person office evaluation/go to MAU at [facility] for any urgent or concerning symptoms. Please refer to After Visit Summary for other counseling recommendations.   I provided 5 minutes of non-face-to-face time during this encounter.  No follow-ups on file.  Future Appointments  Date Time Provider Department Center  05/20/2024  1:55 PM Izell Harari, MD [facility]  05/25/2024  1:55 PM Eveline Lynwood MATSU, MD [facility]  05/25/2024  3:15 PM [facility] PROVIDER 1 [facility]  05/25/2024  3:30 PM [facility] US4 [facility]  06/04/2024  3:55 PM Nicholaus Burnard HERO, MD [facility]  06/11/2024  1:15 PM Cleatus Moccasin, MD [facility]    Harari Izell, MD Center for [facility], [facility]    "

## 2024-05-20 NOTE — Progress Notes (Signed)
 Pt does not have a Blood Pressure Cuff at home

## 2024-05-20 NOTE — Discharge Instructions (Signed)
Fetal kick counts ° °

## 2024-05-20 NOTE — MAU Note (Addendum)
 MAU Triage Note  Miranda Hayes is a 23 y.o. at [redacted]w[redacted]d here in MAU reporting: she hasn't felt baby move since around 7:30/8am. Denies VB and LOF. Reports some abd cramping/tightness that radiates to her back, unsure if she's having ctx.  Also reports occasional hot flashes throughout the day that began lastnight, believes it may be related to anxiety but she's unsure.   Onset of complaint: 0730/0800 Pain score: 1/10 abd/back Vitals:   05/20/24 1554  BP: 131/83  Pulse: (!) 115  Resp: 17  Temp: 98.2 F (36.8 C)  SpO2: 97%     FHT: 147  Lab orders placed from triage: UA

## 2024-05-22 ENCOUNTER — Ambulatory Visit

## 2024-05-25 ENCOUNTER — Ambulatory Visit (HOSPITAL_BASED_OUTPATIENT_CLINIC_OR_DEPARTMENT_OTHER)

## 2024-05-25 ENCOUNTER — Ambulatory Visit: Attending: Obstetrics and Gynecology | Admitting: Obstetrics and Gynecology

## 2024-05-25 ENCOUNTER — Other Ambulatory Visit: Payer: Self-pay

## 2024-05-25 ENCOUNTER — Ambulatory Visit: Admitting: Obstetrics & Gynecology

## 2024-05-25 VITALS — BP 123/75 | HR 111 | Wt 252.6 lb

## 2024-05-25 VITALS — BP 113/74 | HR 102

## 2024-05-25 DIAGNOSIS — O09899 Supervision of other high risk pregnancies, unspecified trimester: Secondary | ICD-10-CM

## 2024-05-25 DIAGNOSIS — Z6839 Body mass index (BMI) 39.0-39.9, adult: Secondary | ICD-10-CM | POA: Diagnosis not present

## 2024-05-25 DIAGNOSIS — O0993 Supervision of high risk pregnancy, unspecified, third trimester: Secondary | ICD-10-CM

## 2024-05-25 DIAGNOSIS — O09293 Supervision of pregnancy with other poor reproductive or obstetric history, third trimester: Secondary | ICD-10-CM

## 2024-05-25 DIAGNOSIS — O09893 Supervision of other high risk pregnancies, third trimester: Secondary | ICD-10-CM

## 2024-05-25 DIAGNOSIS — O35EXX0 Maternal care for other (suspected) fetal abnormality and damage, fetal genitourinary anomalies, not applicable or unspecified: Secondary | ICD-10-CM

## 2024-05-25 DIAGNOSIS — E669 Obesity, unspecified: Secondary | ICD-10-CM | POA: Diagnosis not present

## 2024-05-25 DIAGNOSIS — Z3A38 38 weeks gestation of pregnancy: Secondary | ICD-10-CM

## 2024-05-25 DIAGNOSIS — O09213 Supervision of pregnancy with history of pre-term labor, third trimester: Secondary | ICD-10-CM | POA: Insufficient documentation

## 2024-05-25 DIAGNOSIS — Z362 Encounter for other antenatal screening follow-up: Secondary | ICD-10-CM | POA: Insufficient documentation

## 2024-05-25 DIAGNOSIS — O99213 Obesity complicating pregnancy, third trimester: Secondary | ICD-10-CM

## 2024-05-25 DIAGNOSIS — O0990 Supervision of high risk pregnancy, unspecified, unspecified trimester: Secondary | ICD-10-CM

## 2024-05-25 DIAGNOSIS — O09299 Supervision of pregnancy with other poor reproductive or obstetric history, unspecified trimester: Secondary | ICD-10-CM

## 2024-05-25 DIAGNOSIS — O99820 Streptococcus B carrier state complicating pregnancy: Secondary | ICD-10-CM

## 2024-05-25 NOTE — Progress Notes (Signed)
 After review, MFM consult with provider is not indicated for today  Arna Ranks, MD 05/25/2024 4:15 PM  [facility]

## 2024-05-25 NOTE — Progress Notes (Signed)
" ° °  PRENATAL VISIT NOTE  Subjective:  Miranda Hayes is a 24 y.o. G2P0101 at [redacted]w[redacted]d being seen today for ongoing prenatal care.  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  Patient reports occasional contractions.  Contractions: Irritability. Vag. Bleeding: None.  Movement: Present. Denies leaking of fluid.   The following portions of the patient's history were reviewed and updated as appropriate: allergies, current medications, past family history, past medical history, past social history, past surgical history and problem list.   Objective:   Vitals:   05/25/24 1353  BP: 123/75  Pulse: (!) 111  Weight: 252 lb 9.6 oz (114.6 kg)    Fetal Status:  Fetal Heart Rate (bpm): 151   Movement: Present Presentation: Vertex  General: Alert, oriented and cooperative. Patient is in no acute distress.  Skin: Skin is warm and dry. No rash noted.   Cardiovascular: Normal heart rate noted  Respiratory: Normal respiratory effort, no problems with respiration noted  Abdomen: Soft, gravid, appropriate for gestational age.  Pain/Pressure: Present     Pelvic: Cervical exam performed in the presence of a chaperone Dilation: 2 Effacement (%): 40 Station: -3  Extremities: Normal range of motion.     Mental Status: Normal mood and affect. Normal behavior. Normal judgment and thought content.      02/10/2024    3:49 PM  Depression screen PHQ 2/9  Decreased Interest 0  Down, Depressed, Hopeless 0  PHQ - 2 Score 0  Altered sleeping 0  Tired, decreased energy 2  Change in appetite 0  Feeling bad or failure about yourself  0  Trouble concentrating 0   Moving slowly or fidgety/restless 0  Suicidal thoughts 0  PHQ-9 Score 2      Data saved with a previous flowsheet row definition        02/10/2024    3:49 PM  GAD 7 : Generalized Anxiety Score  Nervous, Anxious, on Edge 0  Control/stop worrying 0  Worry too much - different things 0  Trouble relaxing 0  Restless 0  Easily annoyed or irritable 0  Afraid - awful might happen 0  Total GAD 7 Score 0    Assessment and Plan:  Pregnancy: G2P0101 at [redacted]w[redacted]d 1. Supervision of high risk pregnancy, antepartum (Primary)   2. Group B streptococcal carriage complicating pregnancy   3. History of preterm delivery, currently pregnant   4. History of pre-eclampsia in prior pregnancy, currently pregnant   5. BMI 39.0-39.9,adult   Term labor symptoms and general obstetric precautions including but not limited to vaginal bleeding, contractions, leaking of fluid and fetal movement were reviewed in detail with the patient. Please refer to After Visit Summary for other counseling recommendations.   Return in about 1 week (around 06/01/2024).  Future Appointments  Date Time Provider Department Center  05/25/2024  3:15 PM [facility] PROVIDER 1 [facility]  05/25/2024  3:30 PM [facility] US4 [facility]  06/04/2024  3:55 PM Nicholaus Burnard HERO, MD [facility]  06/11/2024  1:15 PM Cleatus Moccasin, MD [facility]    Lynwood Solomons, MD  "

## 2024-05-29 ENCOUNTER — Encounter: Payer: Self-pay | Admitting: Obstetrics & Gynecology

## 2024-05-29 ENCOUNTER — Inpatient Hospital Stay (HOSPITAL_COMMUNITY)
Admission: AD | Admit: 2024-05-29 | Discharge: 2024-05-29 | Disposition: A | Discharge status: Home / Self Care | Attending: Obstetrics and Gynecology | Admitting: Obstetrics and Gynecology

## 2024-05-29 ENCOUNTER — Other Ambulatory Visit: Payer: Self-pay | Admitting: Certified Nurse Midwife

## 2024-05-29 ENCOUNTER — Encounter: Payer: Self-pay | Admitting: Obstetrics and Gynecology

## 2024-05-29 ENCOUNTER — Encounter (HOSPITAL_COMMUNITY): Payer: Self-pay | Admitting: Obstetrics and Gynecology

## 2024-05-29 ENCOUNTER — Other Ambulatory Visit: Payer: Self-pay

## 2024-05-29 DIAGNOSIS — Z3689 Encounter for other specified antenatal screening: Secondary | ICD-10-CM

## 2024-05-29 DIAGNOSIS — Z3A38 38 weeks gestation of pregnancy: Secondary | ICD-10-CM | POA: Diagnosis not present

## 2024-05-29 DIAGNOSIS — M545 Low back pain, unspecified: Secondary | ICD-10-CM | POA: Diagnosis not present

## 2024-05-29 DIAGNOSIS — O26893 Other specified pregnancy related conditions, third trimester: Secondary | ICD-10-CM | POA: Insufficient documentation

## 2024-05-29 DIAGNOSIS — R102 Pelvic and perineal pain unspecified side: Secondary | ICD-10-CM | POA: Diagnosis not present

## 2024-05-29 DIAGNOSIS — O99891 Other specified diseases and conditions complicating pregnancy: Secondary | ICD-10-CM

## 2024-05-29 DIAGNOSIS — R1031 Right lower quadrant pain: Secondary | ICD-10-CM | POA: Insufficient documentation

## 2024-05-29 LAB — URINALYSIS, ROUTINE W REFLEX MICROSCOPIC
Bilirubin Urine: NEGATIVE
Glucose, UA: NEGATIVE mg/dL
Hgb urine dipstick: NEGATIVE
Ketones, ur: 5 mg/dL — AB
Nitrite: NEGATIVE
Protein, ur: NEGATIVE mg/dL
Specific Gravity, Urine: 1.02 (ref 1.005–1.030)
pH: 5 (ref 5.0–8.0)

## 2024-05-29 LAB — WET PREP, GENITAL
Clue Cells Wet Prep HPF POC: NONE SEEN
Sperm: NONE SEEN
Trich, Wet Prep: NONE SEEN
WBC, Wet Prep HPF POC: >=10 — AB
Yeast Wet Prep HPF POC: NONE SEEN

## 2024-05-29 MED ORDER — ONDANSETRON 4 MG PO TBDP
8.0000 mg | ORAL_TABLET | Freq: Once | ORAL | Status: AC
  Administered 2024-05-29: 8 mg via ORAL
  Filled 2024-05-29: qty 2

## 2024-05-29 MED ORDER — SIMETHICONE 80 MG PO CHEW
80.0000 mg | CHEWABLE_TABLET | Freq: Once | ORAL | Status: AC
  Administered 2024-05-29: 80 mg via ORAL
  Filled 2024-05-29: qty 1

## 2024-05-29 MED ORDER — CYCLOBENZAPRINE HCL 10 MG PO TABS
10.0000 mg | ORAL_TABLET | Freq: Once | ORAL | Status: AC
  Administered 2024-05-29: 10 mg via ORAL
  Filled 2024-05-29: qty 1

## 2024-05-29 MED ORDER — ACETAMINOPHEN 500 MG PO TABS
1000.0000 mg | ORAL_TABLET | Freq: Once | ORAL | Status: AC
  Administered 2024-05-29: 1000 mg via ORAL
  Filled 2024-05-29: qty 2

## 2024-05-29 NOTE — Progress Notes (Signed)
 Orders for IOL, added to elective list on 06/01/24. Cornell Finder, CNM, MSN, IBCLC Certified Nurse Midwife, [facility]

## 2024-05-29 NOTE — MAU Note (Signed)
 Miranda Hayes is a 24 y.o. at [redacted]w[redacted]d here in MAU reporting: she was having ctxs last night that resolved but woke up this morning with body aches and back pain.  States hasn't taken any meds to treat symptoms.  Denies VB, states it doesfeel wetter down there, but denies leaking and changing under garments and wearing a pad.  Denies FM this morning.  Fetus moving during triage, and felt by pt.  LMP: 09/03/2023 Onset of complaint: today Pain score: 5 Vitals:   05/29/24 0900  BP: 123/77  Pulse: (!) 109  Resp: 18  Temp: 97.6 F (36.4 C)  SpO2: 100%     FHT: 146 bpm  Lab orders placed from triage: UA

## 2024-05-29 NOTE — MAU Provider Note (Signed)
 Chief Complaint:  Bodyaches and Back Pain  HPI   Event Date/Time   First Provider Initiated Contact with Patient 05/29/24 0932     Miranda Hayes is a 24 y.o. G2P0101 at [redacted]w[redacted]d who presents to maternity admissions reporting contractions intermittently throughout the night, then woke up this morning with right sided upper and lower abdominal pain, pelvic pain and low back pain. Also has a mild (3/10) headache and a little nausea. Has not taken/tried anything for the pain or had anything to eat/drink today. Denies visual disturbances, vomiting, vaginal bleeding or decreased fetal movement. Does endorse a small amount of yellow vaginal discharge at times.   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 Refer MFM.   Check kidneys again at 36 weeks.     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   Past Surgical History:  Procedure Laterality Date   CHOLECYSTECTOMY  2022   TONGUE SURGERY     Cyst removal at 24 years old   Family History  Problem Relation Age of Onset   Healthy Mother    Healthy Father    Social History[1] Allergies[2] Medications Prior to Admission  Medication Sig Dispense Refill Last Dose/Taking   acetaminophen  (TYLENOL ) 325 MG tablet Take 650 mg by mouth every 6 (six) hours as needed.   05/28/2024   aspirin EC 81 MG tablet Take 81 mg by mouth daily.   05/28/2024   metoCLOPramide  (REGLAN ) 10 MG tablet Take 1 tablet (10 mg total) by mouth 3 (three) times daily with meals as needed for nausea. 30 tablet 0 05/28/2024   Prenatal Vit-Fe Fumarate-FA (MULTIVITAMIN-PRENATAL) 27-0.8 MG TABS tablet Take 1 tablet by mouth daily at 12 noon.   05/29/2024 Morning   promethazine  (PHENERGAN ) 25 MG tablet Take 1 tablet (25 mg  total) by mouth at bedtime and may repeat dose one time if needed.   05/28/2024   pantoprazole  (PROTONIX ) 40 MG tablet Take 1 tablet (40 mg total) by mouth daily. 30 tablet 3 05/27/2024   rizatriptan  (MAXALT ) 10 MG tablet Take 1 tablet (10 mg total) by mouth as needed for migraine. May repeat in 2 hours if needed 10 tablet 0 05/26/2024   I have reviewed patient's Past Medical Hx, Surgical Hx, Family Hx, Social Hx, medications and allergies.   ROS  Pertinent items noted in HPI and remainder of comprehensive ROS otherwise negative.   PHYSICAL EXAM  Patient Vitals for the past 24 hrs:  BP Temp Temp src Pulse Resp SpO2 Height Weight  05/29/24 1217 120/79  97.6 F (36.4 C) Oral 76 18 100 % -- --  05/29/24 0919 132/79 97.6 F (36.4 C) Oral (!) 121 20 97 % -- --  05/29/24 0900 123/77 97.6 F (36.4 C) Oral (!) 109 18 100 % -- --  05/29/24 0855 -- -- -- -- -- -- 5' 6 (1.676 m) 255 lb 3.2 oz (115.8 kg)   Constitutional: Well-developed, well-nourished female in no acute distress.  Cardiovascular: normal rate & rhythm, warm and well-perfused Respiratory: normal effort, no problems with respiration noted GI: Abd soft, non-tender, non-distended MS: Extremities nontender, no edema, normal ROM Neurologic: Alert and oriented x 4.  GU: no CVA tenderness Pelvic: normal external female genitalia, physiologic discharge, no blood, cervix clean and unchanged from previous exam  Dilation: 2 Effacement (%): 50 Cervical Position: Posterior Station: Ballotable Presentation: Vertex Exam by:: Cornell Finder, CNM  Fetal Tracing: reactive Baseline: 145 Variability: moderate Accelerations: 15x15 Decelerations: none Toco: irregular, UI   Labs: Results for orders placed or performed during the hospital encounter of 05/29/24 (from the past 24 hours)  Urinalysis, Routine w reflex microscopic -Urine, Clean Catch     Status: Abnormal   Collection Time: 05/29/24  9:19 AM  Result Value Ref Range   Color, Urine YELLOW YELLOW   APPearance CLOUDY (A) CLEAR   Specific Gravity, Urine 1.020 1.005 - 1.030   pH 5.0 5.0 - 8.0   Glucose, UA NEGATIVE NEGATIVE mg/dL   Hgb urine dipstick NEGATIVE NEGATIVE   Bilirubin Urine NEGATIVE NEGATIVE   Ketones, ur 5 (A) NEGATIVE mg/dL   Protein, ur NEGATIVE NEGATIVE mg/dL   Nitrite NEGATIVE NEGATIVE   Leukocytes,Ua LARGE (A) NEGATIVE   RBC / HPF 0-5 0 - 5 RBC/hpf   WBC, UA 11-20 0 - 5 WBC/hpf   Bacteria, UA RARE (A) NONE SEEN   Squamous Epithelial / HPF 11-20 0 - 5 /HPF   Mucus PRESENT   Wet prep, genital     Status: Abnormal   Collection Time: 05/29/24  9:42 AM   Specimen: Vaginal  Result Value Ref Range   Yeast Wet  Prep HPF POC NONE SEEN NONE SEEN   Trich, Wet Prep NONE SEEN NONE SEEN   Clue Cells Wet Prep HPF POC NONE SEEN NONE SEEN   WBC, Wet Prep HPF POC >=10 (A) <10   Sperm NONE SEEN    Imaging:  No results found.  MDM & MAU COURSE  MDM: Moderate  MAU Course: Orders Placed This Encounter  Procedures   Wet prep, genital   Urinalysis, Routine w reflex microscopic -Urine, Clean Catch   Discharge patient   Meds ordered this encounter  Medications   ondansetron  (ZOFRAN -ODT) disintegrating tablet 8 mg   acetaminophen  (TYLENOL ) tablet 1,000 mg   cyclobenzaprine  (FLEXERIL ) tablet  10 mg   simethicone  (MYLICON) chewable tablet 80 mg   Offered IV fluids and meds, declined IV. Gave oral zofran  and encouraged fluids, once nausea resolved, ordered additional meds. Pain seems aggravated by movement, not picking up many contractions and pt endorses not being able to tell if she's contracting or just achy. Will try basic pain relief prior to additional workup. Pain relieved down to 3/10, pt strongly desires to stay for IOL due to size of baby and rounds of prodromal labor. Called [facility] to ask the MD if she could be induced today, call likely routed to Dr. Eveline. Explained that it would be inappropriate to keep her today due to gestational age and her unchanged cervix, etc but expressed understanding of her discomfort and offered elective IOL at 39wks (Monday). Pt accepted, added to eIOL list on 1/19 and orders placed.  Replied to office message so clinical pool could see IOL has been scheduled Gave strong labor precautions and helped brainstorm ideas to ease prodromal labor and pelvic pain over the weekend.   ASSESSMENT   1. Pelvic pain affecting pregnancy in third trimester, antepartum   2. Back pain affecting pregnancy   3. NST (non-stress test) reactive   4. [redacted] weeks gestation of pregnancy    PLAN  Discharge home in stable condition with return precautions and eIOL     Follow-up Information      [facility] Follow up.   Specialty: Obstetrics and Gynecology Why: as scheduled for ongoing prenatal care Contact information: [address] [place]  [address] [phone]                Allergies as of 05/29/2024       Reactions   Latex Dermatitis, Itching        Medication List     TAKE these medications    acetaminophen  325 MG tablet Commonly known as: TYLENOL  Take 650 mg by mouth every 6 (six) hours as needed.   aspirin EC 81 MG tablet Take 81 mg by mouth daily.   metoCLOPramide  10 MG tablet Commonly known as: REGLAN  Take 1 tablet (10 mg total) by mouth 3 (three) times daily with meals as needed for nausea.   multivitamin-prenatal 27-0.8 MG Tabs tablet Take 1 tablet by mouth daily at 12 noon.   pantoprazole  40 MG tablet Commonly known as: Protonix  Take 1 tablet (40 mg total) by mouth daily.   promethazine  25 MG tablet Commonly known as: PHENERGAN  Take 1 tablet (25 mg total) by mouth at bedtime and may repeat dose one time if needed.   rizatriptan  10 MG tablet Commonly known as: Maxalt  Take 1 tablet (10 mg total) by mouth as needed for migraine. May repeat in 2 hours if needed       Cornell Finder, CNM, MSN, [facility] Certified Nurse Midwife, [facility]        [1]  Social History Tobacco Use   Smoking status: Never   Smokeless tobacco: Never  Vaping Use   Vaping status: Every Day   Substances: Nicotine  Substance Use Topics   Alcohol use: Not Currently   Drug use: Not Currently    Types: Marijuana    Comment: Last used beginning of August 2025  [2]  Allergies Allergen Reactions   Latex Dermatitis and Itching

## 2024-06-02 ENCOUNTER — Inpatient Hospital Stay (HOSPITAL_COMMUNITY): Admitting: Anesthesiology

## 2024-06-02 ENCOUNTER — Encounter (HOSPITAL_COMMUNITY): Payer: Self-pay | Admitting: Family Medicine

## 2024-06-02 ENCOUNTER — Inpatient Hospital Stay (HOSPITAL_COMMUNITY)
Admission: RE | Admit: 2024-06-02 | Discharge: 2024-06-04 | DRG: 807 | Disposition: A | Discharge status: Home / Self Care | Attending: Obstetrics and Gynecology | Admitting: Obstetrics and Gynecology

## 2024-06-02 ENCOUNTER — Other Ambulatory Visit: Payer: Self-pay

## 2024-06-02 DIAGNOSIS — O99334 Smoking (tobacco) complicating childbirth: Secondary | ICD-10-CM | POA: Diagnosis present

## 2024-06-02 DIAGNOSIS — Z7982 Long term (current) use of aspirin: Secondary | ICD-10-CM | POA: Diagnosis not present

## 2024-06-02 DIAGNOSIS — Z3A39 39 weeks gestation of pregnancy: Secondary | ICD-10-CM

## 2024-06-02 DIAGNOSIS — O26893 Other specified pregnancy related conditions, third trimester: Secondary | ICD-10-CM | POA: Diagnosis present

## 2024-06-02 DIAGNOSIS — F17290 Nicotine dependence, other tobacco product, uncomplicated: Secondary | ICD-10-CM | POA: Diagnosis present

## 2024-06-02 DIAGNOSIS — O99824 Streptococcus B carrier state complicating childbirth: Secondary | ICD-10-CM | POA: Diagnosis present

## 2024-06-02 LAB — CBC
HCT: 37.1 % (ref 36.0–46.0)
Hemoglobin: 13 g/dL (ref 12.0–15.0)
MCH: 31.6 pg (ref 26.0–34.0)
MCHC: 35 g/dL (ref 30.0–36.0)
MCV: 90 fL (ref 80.0–100.0)
Platelets: 151 K/uL (ref 150–400)
RBC: 4.12 MIL/uL (ref 3.87–5.11)
RDW: 13.8 % (ref 11.5–15.5)
WBC: 10.3 K/uL (ref 4.0–10.5)
nRBC: 0 % (ref 0.0–0.2)

## 2024-06-02 LAB — TYPE AND SCREEN
ABO/RH(D): A POS
Antibody Screen: NEGATIVE

## 2024-06-02 LAB — SYPHILIS: RPR W/REFLEX TO RPR TITER AND TREPONEMAL ANTIBODIES, TRADITIONAL SCREENING AND DIAGNOSIS ALGORITHM: RPR Ser Ql: NONREACTIVE

## 2024-06-02 MED ORDER — OXYCODONE-ACETAMINOPHEN 5-325 MG PO TABS: 1.0000 | ORAL_TABLET | ORAL | Status: DC | PRN

## 2024-06-02 MED ORDER — COCONUT OIL OIL
1.0000 | TOPICAL_OIL | Status: DC | PRN
  Administered 2024-06-03: 1 via TOPICAL

## 2024-06-02 MED ORDER — OXYTOCIN-SODIUM CHLORIDE 30-0.9 UT/500ML-% IV SOLN
2.5000 [IU]/h | INTRAVENOUS | Status: DC
  Administered 2024-06-02: 2.5 [IU]/h via INTRAVENOUS

## 2024-06-02 MED ORDER — PENICILLIN G POT IN DEXTROSE 60000 UNIT/ML IV SOLN
3.0000 10*6.[IU] | INTRAVENOUS | Status: DC
  Administered 2024-06-02 (×2): 3 10*6.[IU] via INTRAVENOUS
  Filled 2024-06-02 (×2): qty 50

## 2024-06-02 MED ORDER — SIMETHICONE 80 MG PO CHEW: 80.0000 mg | CHEWABLE_TABLET | ORAL | Status: DC | PRN

## 2024-06-02 MED ORDER — PHENYLEPHRINE 80 MCG/ML (10ML) SYRINGE FOR IV PUSH (FOR BLOOD PRESSURE SUPPORT): 80.0000 ug | PREFILLED_SYRINGE | INTRAVENOUS | Status: DC | PRN

## 2024-06-02 MED ORDER — SOD CITRATE-CITRIC ACID 500-334 MG/5ML PO SOLN
30.0000 mL | ORAL | Status: DC | PRN
  Filled 2024-06-02: qty 30

## 2024-06-02 MED ORDER — ONDANSETRON HCL 4 MG/2ML IJ SOLN: 4.0000 mg | INTRAMUSCULAR | Status: DC | PRN

## 2024-06-02 MED ORDER — EPHEDRINE 5 MG/ML INJ: 10.0000 mg | INTRAVENOUS | Status: DC | PRN

## 2024-06-02 MED ORDER — BENZOCAINE-MENTHOL 20-0.5 % EX AERO: 1.0000 | INHALATION_SPRAY | CUTANEOUS | Status: DC | PRN

## 2024-06-02 MED ORDER — ONDANSETRON HCL 4 MG/2ML IJ SOLN: 4.0000 mg | Freq: Four times a day (QID) | INTRAMUSCULAR | Status: DC | PRN

## 2024-06-02 MED ORDER — TERBUTALINE SULFATE 1 MG/ML IJ SOLN: 0.2500 mg | Freq: Once | INTRAMUSCULAR | Status: DC | PRN

## 2024-06-02 MED ORDER — FENTANYL-BUPIVACAINE-NACL 0.5-0.125-0.9 MG/250ML-% EP SOLN
12.0000 mL/h | EPIDURAL | Status: DC | PRN
  Administered 2024-06-02: 12 mL/h via EPIDURAL
  Filled 2024-06-02: qty 250

## 2024-06-02 MED ORDER — DIPHENHYDRAMINE HCL 50 MG/ML IJ SOLN: 12.5000 mg | INTRAMUSCULAR | Status: DC | PRN

## 2024-06-02 MED ORDER — LACTATED RINGERS IV SOLN: 500.0000 mL | Freq: Once | INTRAVENOUS | Status: DC

## 2024-06-02 MED ORDER — OXYCODONE HCL 5 MG PO TABS: 5.0000 mg | ORAL_TABLET | ORAL | Status: DC | PRN

## 2024-06-02 MED ORDER — OXYCODONE HCL 5 MG PO TABS: 10.0000 mg | ORAL_TABLET | ORAL | Status: DC | PRN

## 2024-06-02 MED ORDER — LACTATED RINGERS IV SOLN: INTRAVENOUS | Status: DC

## 2024-06-02 MED ORDER — DIPHENHYDRAMINE HCL 25 MG PO CAPS: 25.0000 mg | ORAL_CAPSULE | Freq: Four times a day (QID) | ORAL | Status: DC | PRN

## 2024-06-02 MED ORDER — ACETAMINOPHEN 325 MG PO TABS: 650.0000 mg | ORAL_TABLET | ORAL | Status: DC | PRN

## 2024-06-02 MED ORDER — ZOLPIDEM TARTRATE 5 MG PO TABS: 5.0000 mg | ORAL_TABLET | Freq: Every evening | ORAL | Status: DC | PRN

## 2024-06-02 MED ORDER — PRENATAL MULTIVITAMIN CH
1.0000 | ORAL_TABLET | Freq: Every day | ORAL | Status: DC
  Administered 2024-06-03 – 2024-06-04 (×2): 1 via ORAL
  Filled 2024-06-02 (×2): qty 1

## 2024-06-02 MED ORDER — LIDOCAINE HCL (PF) 1 % IJ SOLN: 30.0000 mL | INTRAMUSCULAR | Status: DC | PRN

## 2024-06-02 MED ORDER — MISOPROSTOL 25 MCG QUARTER TABLET
25.0000 ug | ORAL_TABLET | Freq: Once | ORAL | Status: AC
  Administered 2024-06-02: 25 ug via VAGINAL
  Filled 2024-06-02: qty 1

## 2024-06-02 MED ORDER — MISOPROSTOL 50MCG HALF TABLET: 50.0000 ug | ORAL_TABLET | Freq: Once | ORAL | Status: DC

## 2024-06-02 MED ORDER — OXYTOCIN BOLUS FROM INFUSION
333.0000 mL | Freq: Once | INTRAVENOUS | Status: AC
  Administered 2024-06-02: 333 mL via INTRAVENOUS

## 2024-06-02 MED ORDER — SODIUM CHLORIDE 0.9 % IV SOLN
5.0000 10*6.[IU] | Freq: Once | INTRAVENOUS | Status: AC
  Administered 2024-06-02: 5 10*6.[IU] via INTRAVENOUS
  Filled 2024-06-02: qty 5

## 2024-06-02 MED ORDER — FENTANYL CITRATE (PF) 100 MCG/2ML IJ SOLN: 50.0000 ug | INTRAMUSCULAR | Status: DC | PRN

## 2024-06-02 MED ORDER — SENNOSIDES-DOCUSATE SODIUM 8.6-50 MG PO TABS
2.0000 | ORAL_TABLET | Freq: Every day | ORAL | Status: DC
  Administered 2024-06-03: 2 via ORAL
  Filled 2024-06-02: qty 2

## 2024-06-02 MED ORDER — FLEET ENEMA RE ENEM: 1.0000 | ENEMA | RECTAL | Status: DC | PRN

## 2024-06-02 MED ORDER — DIBUCAINE (PERIANAL) 1 % EX OINT: 1.0000 | TOPICAL_OINTMENT | CUTANEOUS | Status: DC | PRN

## 2024-06-02 MED ORDER — OXYTOCIN-SODIUM CHLORIDE 30-0.9 UT/500ML-% IV SOLN
1.0000 m[IU]/min | INTRAVENOUS | Status: DC
  Administered 2024-06-02: 2 m[IU]/min via INTRAVENOUS
  Filled 2024-06-02: qty 500

## 2024-06-02 MED ORDER — OXYCODONE-ACETAMINOPHEN 5-325 MG PO TABS: 2.0000 | ORAL_TABLET | ORAL | Status: DC | PRN

## 2024-06-02 MED ORDER — ACETAMINOPHEN 325 MG PO TABS
650.0000 mg | ORAL_TABLET | ORAL | Status: DC | PRN
  Administered 2024-06-03 – 2024-06-04 (×2): 650 mg via ORAL
  Filled 2024-06-02 (×2): qty 2

## 2024-06-02 MED ORDER — IBUPROFEN 600 MG PO TABS
600.0000 mg | ORAL_TABLET | Freq: Four times a day (QID) | ORAL | Status: DC
  Administered 2024-06-03 – 2024-06-04 (×5): 600 mg via ORAL
  Filled 2024-06-02 (×6): qty 1

## 2024-06-02 MED ORDER — MISOPROSTOL 25 MCG QUARTER TABLET: 25.0000 ug | ORAL_TABLET | Freq: Once | ORAL | Status: DC

## 2024-06-02 MED ORDER — LACTATED RINGERS IV SOLN: 500.0000 mL | INTRAVENOUS | Status: DC | PRN

## 2024-06-02 MED ORDER — WITCH HAZEL-GLYCERIN EX PADS: 1.0000 | MEDICATED_PAD | CUTANEOUS | Status: DC | PRN

## 2024-06-02 MED ORDER — LIDOCAINE HCL (PF) 1 % IJ SOLN
INTRAMUSCULAR | Status: DC | PRN
  Administered 2024-06-02: 5 mL via EPIDURAL

## 2024-06-02 MED ORDER — ONDANSETRON HCL 4 MG PO TABS: 4.0000 mg | ORAL_TABLET | ORAL | Status: DC | PRN

## 2024-06-02 NOTE — Anesthesia Preprocedure Evaluation (Addendum)
 "                                  Anesthesia Evaluation  Patient identified by MRN, date of birth, ID band Patient awake    Reviewed: Allergy & Precautions, NPO status , Patient's Chart, lab work & pertinent test results  Airway Mallampati: II  TM Distance: >3 FB Neck ROM: Full    Dental no notable dental hx.    Pulmonary    Pulmonary exam normal breath sounds clear to auscultation       Cardiovascular Exercise Tolerance: Good Normal cardiovascular exam Rhythm:Regular Rate:Normal     Neuro/Psych  Headaches PSYCHIATRIC DISORDERS Anxiety Depression       GI/Hepatic negative GI ROS, Neg liver ROS,,,  Endo/Other    Renal/GU Renal diseaseLab Results      Component                Value               Date                            K                        3.8                 02/18/2024                   CREATININE               0.50                02/18/2024                     Musculoskeletal   Abdominal   Peds  Hematology Lab Results      Component                Value               Date                      WBC                      10.3                06/02/2024                HGB                      13.0                06/02/2024                HCT                      37.1                06/02/2024                MCV                      90.0                06/02/2024  PLT                      151                 06/02/2024              Anesthesia Other Findings Latex   Reproductive/Obstetrics (+) Pregnancy                              Anesthesia Physical Anesthesia Plan  ASA: 3  Anesthesia Plan: Epidural   Post-op Pain Management:    Induction:   PONV Risk Score and Plan: Ondansetron  and Aprepitant  Airway Management Planned:   Additional Equipment: None  Intra-op Plan:   Post-operative Plan:   Informed Consent: I have reviewed the patients History and Physical, chart, labs and  discussed the procedure including the risks, benefits and alternatives for the proposed anesthesia with the patient or authorized representative who has indicated his/her understanding and acceptance.       Plan Discussed with:   Anesthesia Plan Comments: (39.2 wk G2P0 w BMI 41 for LEA)         Anesthesia Quick Evaluation  "

## 2024-06-02 NOTE — Anesthesia Procedure Notes (Signed)
 Epidural Patient location during procedure: OB Start time: 06/02/2024 2:29 PM End time: 06/02/2024 2:45 PM  Staffing Anesthesiologist: Jefm Garnette LABOR, MD Performed: anesthesiologist   Preanesthetic Checklist Completed: patient identified, IV checked, site marked, risks and benefits discussed, surgical consent, monitors and equipment checked, pre-op evaluation and timeout performed  Epidural Patient position: sitting Prep: DuraPrep and site prepped and draped Patient monitoring: continuous pulse ox and blood pressure Approach: midline Location: L3-L4 Injection technique: LOR air  Needle:  Needle type: Tuohy  Needle gauge: 17 G Needle length: 9 cm and 9 Needle insertion depth: 9 cm Catheter type: closed end flexible Catheter size: 19 Gauge Catheter at skin depth: 10 and 16 cm Test dose: negative  Assessment Events: blood not aspirated, no cerebrospinal fluid, injection not painful, no injection resistance, no paresthesia and negative IV test  Additional Notes Patient identified. Risks/Benefits/Options discussed with patient including but not limited to bleeding, infection, nerve damage, paralysis, failed block, incomplete pain control, headache, blood pressure changes, nausea, vomiting, reactions to medication both or allergic, itching and postpartum back pain. Confirmed with bedside nurse the patient's most recent platelet count. Confirmed with patient that they are not currently taking any anticoagulation, have any bleeding history or any family history of bleeding disorders. Patient expressed understanding and wished to proceed. All questions were answered. Sterile technique was used throughout the entire procedure. Please see nursing notes for vital signs. Test dose was given through epidural needle and negative prior to continuing to dose epidural or start infusion. Warning signs of high block given to the patient including shortness of breath, tingling/numbness in hands,  complete motor block, or any concerning symptoms with instructions to call for help. Patient was given instructions on fall risk and not to get out of bed. All questions and concerns addressed with instructions to call with any issues.  1 Attempt (S) . Patient tolerated procedure well.

## 2024-06-02 NOTE — Progress Notes (Addendum)
 Labor Progress Note Miranda Hayes is a 24 y.o. G2P0101 at [redacted]w[redacted]d presented for eIOL S: Patient feeling ok after epidural placement. States she thinks her water broke spontaneously. No other acute concerns. Discussed anticipated course of her induction.   O:  BP (!) 93/50   Pulse 78   Temp 97.7 F (36.5 C) (Axillary)   Resp 16   Ht 5' 6 (1.676 m)   Wt 117.3 kg   LMP 09/03/2023 (Approximate)   SpO2 100%   BMI 41.72 kg/m  EFM: Baseline 130, moderate variability, reactive, negative decels  CVE: Dilation: 4 Effacement (%): 80 Cervical Position: Middle Station: -2 Presentation: Vertex Exam by:: Miranda Ellithorpe, MD No membranes palpated.   A&P: 24 y.o. G2P0101 [redacted]w[redacted]d eIOL #Labor: S/p SROM since last exam. Pitocin  at 6, up-titrate per protocol.  #Pain: She is now s/p epidural placement #FWB: Cat 1, baseline 130 w/ moderate variability  Miranda Scriver, DO 4:32 PM   Attestation of Supervision of Resident: Evaluation and management procedures were performed by the learners: Family Medicine Resident under my supervision. I was immediately available for direct supervision, assistance and direction throughout this encounter.  I also confirm that I have verified the information documented in the residents note, and that I have also personally reperformed the pertinent components of the physical exam and all of the medical decision making activities.  I have also made any necessary editorial changes.  Miranda Maier, DO FM-OB Fellow [facility] [employer]

## 2024-06-02 NOTE — H&P (Cosign Needed Addendum)
 OBSTETRIC ADMISSION HISTORY AND PHYSICAL  Miranda Hayes is a 24 y.o. female G10P0101 with IUP at [redacted]w[redacted]d by 12w US  presenting for elective IOL. She reports +FMs, No LOF, no VB, no blurry vision, headaches or peripheral edema, and RUQ pain.  She plans on both feeding. She is undecided on birth control, will revisit. She received her prenatal care at [facility]   Dating: By 12w US  --->  Estimated Date of Delivery: 06/07/24  Sono@[redacted]w[redacted]d , CWD, normal anatomy, cephalic presentation, posterior placenta, 2243g, 72% EFW   Prenatal History/Complications: none. Patient does endorse a hx of pre eclampsia during her previous postpartum period. 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 Refer MFM.   Check kidneys again at 36 weeks.     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   Obesity 04/04/2012   Postoperative abdominal pain 12/18/2021   Vitamin D deficiency 10/18/2022    Past Surgical History: Past  Surgical History:  Procedure Laterality Date   CHOLECYSTECTOMY  2022   TONGUE SURGERY     Cyst removal at 24 years old    Obstetrical History: OB History  Gravida Para Term Preterm AB Living  2 1 0 1 0 1  SAB IAB Ectopic Multiple Live Births  0 0 0  1    # Outcome Date GA Lbr Len/2nd Weight Sex Type Anes PTL Lv  2 Current           1 Preterm 09/04/19 [redacted]w[redacted]d 04:00 / 00:31 2670 g F Vag-Spont  Y LIV     Complications: Pre-eclampsia    Obstetric Comments  Pre-E first pregnancy.  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   Social History Social History   Socioeconomic History   Marital status: Single    Spouse name: Not on file   Number of children: Not on file   Years of education: Not on file   Highest education level: Not on file  Occupational History   Not on file  Tobacco Use   Smoking status: Never   Smokeless tobacco: Never  Vaping Use   Vaping status: Every Day   Substances: Nicotine  Substance and Sexual Activity   Alcohol use: Not Currently   Drug use: Not Currently    Types: Marijuana    Comment: Last used beginning of August 2025   Sexual activity: Yes    Birth control/protection: None  Other Topics Concern   Not on file  Social  History Narrative   Not on file   Social Drivers of Health   Tobacco Use: Low Risk (06/02/2024)   Patient History    Smoking Tobacco Use: Never    Smokeless Tobacco Use: Never    Passive Exposure: Not on file  Financial Resource Strain: Low Risk (10/17/2022)   Received from [facility]   Overall Financial Resource Strain (CARDIA)    Difficulty of Paying Living Expenses: Not hard at all  Food Insecurity: No Food Insecurity (06/02/2024)   Epic    Worried About Radiation Protection Practitioner of Food in the Last Year: Never true    Ran Out of Food in the Last Year: Never true  Transportation Needs: No Transportation Needs (06/02/2024)   Epic    Lack of Transportation (Medical): No    Lack of  Transportation (Non-Medical): No  Physical Activity: Not on file  Stress: No Stress Concern Present (12/13/2021)   Received from [facility] - Occupational Stress Questionnaire    Feeling of Stress : Not at all  Social Connections: Not on file  Depression (PHQ2-9): Low Risk (02/10/2024)   Depression (PHQ2-9)    PHQ-2 Score: 2  Alcohol Screen: Not on file  Housing: Low Risk (06/02/2024)   Epic    Unable to Pay for Housing in the Last Year: No    Number of Times Moved in the Last Year: 1    Homeless in the Last Year: No  Utilities: Not At Risk (06/02/2024)   Epic    Threatened with loss of utilities: No  Health Literacy: Not on file    Family History: Family History  Problem Relation Age of Onset   Healthy Mother    Healthy Father     Allergies: Allergies[1]  Medications Prior to Admission  Medication Sig Dispense Refill Last Dose/Taking   acetaminophen  (TYLENOL ) 325 MG tablet Take 650 mg by mouth every 6 (six) hours as needed.   Past Week   aspirin EC 81 MG tablet Take 81 mg by mouth daily.   Past Week   metoCLOPramide  (REGLAN ) 10 MG tablet Take 1 tablet (10 mg total) by mouth 3 (three) times daily with meals as needed for nausea. 30 tablet 0 Past Week   pantoprazole  (PROTONIX ) 40 MG tablet Take 1 tablet (40 mg total) by mouth daily. 30 tablet 3 Past Week   Prenatal Vit-Fe Fumarate-FA (MULTIVITAMIN-PRENATAL) 27-0.8 MG TABS tablet Take 1 tablet by mouth daily at 12 noon.   Past Week   promethazine  (PHENERGAN ) 25 MG tablet Take 1 tablet (25 mg total) by mouth at bedtime and may repeat dose one time if needed.   Past Week   rizatriptan  (MAXALT ) 10 MG tablet Take 1 tablet (10 mg total) by mouth as needed for migraine. May repeat in 2 hours if needed 10 tablet 0 Past Week     Review of Systems   All systems reviewed and negative except as stated in HPI  Blood pressure 113/79, pulse 85, temperature 97.7 F (36.5 C), temperature source  Axillary, resp. rate 16, height 5' 6 (1.676 m), weight 117.3 kg, last menstrual period 09/03/2023. General appearance: alert, cooperative, and no distress Lungs: clear to auscultation bilaterally Heart: regular rate and rhythm Abdomen: soft, non-tender; bowel sounds normal Extremities: Homans sign is negative, no sign of DVT Fetal monitoringBaseline: 120 bpm, Variability: Good {> 6 bpm), and Accelerations: Reactive Uterine activity: sparse irritability  Dilation: 3.5 Effacement (%): 60 Station: -1 Exam by:: Nicholaus, RN  Prenatal labs: ABO, Rh: --/--/A POS (01/20 9350) Antibody: NEG (01/20 0649) Rubella:   RPR: Non Reactive (10/30 0930)  HBsAg:    HIV: Non Reactive (10/30 0930)  GBS: Positive/-- (12/30 1459)    Lab Results  Component Value Date   GBS Positive (A) 05/12/2024   GTT WNL Genetic screening: Panorama WNL per pt. Declined amnio this pregnancy as she had genetic consultation after chromosomal abnormality was detected in her daughter (born 2021).  Anatomy US  WNL  Immunization History  Administered Date(s) Administered   DTaP 06/04/2000, 08/05/2000, 10/07/2000, 09/19/2001, 01/10/2006   Dtap, Unspecified 06/04/2000, 08/05/2000, 10/07/2000, 09/19/2001, 01/10/2006   HIB, Unspecified 06/04/2000, 08/05/2000, 10/07/2000, 09/19/2001   HPV 9-valent 03/26/2014   HPV Quadrivalent 04/04/2012, 04/16/2013   Hep B, Unspecified [birth year] 1999, 04/30/2000, 01/31/2001   Hepatitis A, Ped/Adol-2 Dose 01/10/2006, 12/01/2007   Hepatitis B, PED/ADOLESCENT 2000/03/11, 04/30/2000, 01/31/2001   Hpv-Unspecified 03/26/2014   INFLUENZA, HIGH DOSE SEASONAL PF 03/23/2010, 03/30/2011, 04/04/2012, 04/16/2013, 03/26/2014, 03/25/2015, 03/22/2016, 03/27/2017, 03/11/2018, 04/02/2019   IPV 06/04/2000, 08/05/2000, 01/31/2001, 01/10/2006   Influenza Nasal 03/23/2010, 03/30/2011   Influenza, Quadrivalent, Recombinant, Inj, Pf 02/11/2020   Influenza,Quad,Nasal, Live 04/04/2012, 04/16/2013, 03/26/2014    Influenza,inj,Quad PF,6+ Mos 03/25/2015, 03/22/2016, 03/27/2017, 03/11/2018, 04/02/2019   Influenza,inj,quad, With Preservative 03/23/2010, 03/25/2015, 03/22/2016, 03/27/2017, 03/11/2018, 04/02/2019   Influenza-Unspecified 03/23/2010, 03/23/2010, 03/30/2011, 03/30/2011, 04/04/2012, 04/04/2012, 04/16/2013, 04/16/2013, 03/26/2014, 03/26/2014, 03/25/2015, 03/25/2015, 03/25/2015, 03/22/2016, 03/22/2016, 03/22/2016, 03/27/2017, 03/27/2017, 03/27/2017, 03/11/2018, 03/11/2018, 03/11/2018, 04/02/2019, 04/02/2019, 04/02/2019   MMR 01/21/2002, 01/10/2006   Meningococcal Acwy, Unspecified 03/30/2011   Meningococcal Conjugate 03/30/2011, 03/22/2016   PPD Test 01/26/2022, 01/26/2022, 04/25/2022, 04/25/2022   Pneumococcal Conjugate PCV 7 06/04/2000, 08/05/2000, 10/07/2000   Pneumococcal Conjugate,unspecified 06/04/2000, 08/05/2000, 10/07/2000   Pneumococcal Conjugate-13 06/04/2000, 08/05/2000, 10/07/2000   Tdap 03/30/2011, 07/22/2019, 03/24/2024   Varicella 05/23/2001, 01/10/2006    Prenatal Transfer Tool  Maternal Diabetes: No Genetic Screening: Normal, per pt Maternal Ultrasounds/Referrals: Normal Fetal Ultrasounds or other Referrals:  None Maternal Substance Abuse:  No Significant Maternal Medications:  Meds include: Protonix  Significant Maternal Lab Results: Group B Strep positive Number of Prenatal Visits:greater than 3 verified prenatal visits Maternal Vaccinations:TDap Other Comments:  None   Results for orders placed or performed during the hospital encounter of 06/02/24 (from the past 24 hours)  CBC   Collection Time: 06/02/24  6:49 AM  Result Value Ref Range   WBC 10.3 4.0 - 10.5 K/uL   RBC 4.12 3.87 - 5.11 MIL/uL   Hemoglobin 13.0 12.0 - 15.0 g/dL   HCT 62.8 63.9 - 53.9 %   MCV 90.0 80.0 - 100.0 fL   MCH 31.6 26.0 - 34.0 pg   MCHC 35.0 30.0 - 36.0 g/dL   RDW 86.1 88.4 - 84.4 %   Platelets 151 150 - 400 K/uL   nRBC 0.0 0.0 - 0.2 %  Type and screen   Collection Time: 06/02/24   6:49 AM  Result Value Ref Range   ABO/RH(D) A POS    Antibody Screen NEG    Sample Expiration      06/05/2024,2359 Performed at [facility], 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 G2P0101 at 107w2d here for eIOL  #Labor: S/p 25mg  vaginal cytotec  @ 0746.  #Pain: Would like to start w/ nitrous oxide and eventually get an epidural #FWB: Cat 1, baseline 120 w/ moderate variability. Good FM per patient.  #GBS status:  positive #Feeding: Breastmilk  and Formula #Reproductive Life planning: Undecided #Circ:  yes  Leafy Scriver, DO  06/02/2024, 9:55 AM   Attestation of Supervision of Resident: Evaluation and management procedures were performed by the learners: Family Medicine Resident under my supervision. I was immediately available for direct supervision, assistance and direction throughout this encounter.  I also confirm that I have verified the information documented in the residents note, and that I have also personally reperformed the pertinent components of the physical exam and all of the medical decision making activities.  I have also made any necessary editorial changes.  Barabara Maier, DO FM-OB Fellow Center for [facility]      [1]  Allergies Allergen Reactions   Latex Dermatitis and Itching

## 2024-06-02 NOTE — Discharge Summary (Signed)
 "   Postpartum Discharge Summary  Date of Service updated***    Patient Name: Miranda Hayes DOB: 09/15/2000 MRN: [id number]  Date of admission: 06/02/2024 Delivery date:  Delivering provider:   Date of discharge: 06/02/2024  Admitting diagnosis: Indication for care in labor or delivery [O75.9] Intrauterine pregnancy: [redacted]w[redacted]d     Secondary diagnosis:  Principal Problem:   SVD (spontaneous vaginal delivery)  Additional problems: None    Discharge diagnosis: Term Pregnancy Delivered                                              Post partum procedures:{Postpartum procedures:23558} Augmentation: Pitocin  and Cytotec  Complications: None  Hospital course: Onset of Labor With Vaginal Delivery      24 y.o. yo G2P0101 at [redacted]w[redacted]d was admitted for an elective induction of labor on 06/02/2024. Labor course was uncomplicated.  Membrane Rupture Time/Date: 4:15 PM,06/02/2024  Delivery Method:  Operative Delivery:N/A Episiotomy:   Lacerations:    Patient had a postpartum course complicated by ***.  She is ambulating, tolerating a regular diet, passing flatus, and urinating well. Patient is discharged home in stable condition on 06/02/24.  Newborn Data: Birth date:  Birth time:  Gender:  Living status:  Apgars: ,  Weight:   Magnesium Sulfate received: No BMZ received: No Rhophylac:N/A MMR:N/A T-DaP:Given prenatally Flu: No RSV Vaccine received: No Transfusion:{Transfusion received:30440034}  Immunizations received: Immunization History  Administered Date(s) Administered   DTaP 06/04/2000, 08/05/2000, 10/07/2000, 09/19/2001, 01/10/2006   Dtap, Unspecified 06/04/2000, 08/05/2000, 10/07/2000, 09/19/2001, 01/10/2006   HIB, Unspecified 06/04/2000, 08/05/2000, 10/07/2000, 09/19/2001   HPV 9-valent 03/26/2014   HPV Quadrivalent 04/04/2012, 04/16/2013   Hep B, Unspecified 03/29/2000, 04/30/2000, 01/31/2001   Hepatitis A, Ped/Adol-2 Dose 01/10/2006, 12/01/2007   Hepatitis B, PED/ADOLESCENT  07/07/1999, 04/30/2000, 01/31/2001   Hpv-Unspecified 03/26/2014   INFLUENZA, HIGH DOSE SEASONAL PF 03/23/2010, 03/30/2011, 04/04/2012, 04/16/2013, 03/26/2014, 03/25/2015, 03/22/2016, 03/27/2017, 03/11/2018, 04/02/2019   IPV 06/04/2000, 08/05/2000, 01/31/2001, 01/10/2006   Influenza Nasal 03/23/2010, 03/30/2011   Influenza, Quadrivalent, Recombinant, Inj, Pf 02/11/2020   Influenza,Quad,Nasal, Live 04/04/2012, 04/16/2013, 03/26/2014   Influenza,inj,Quad PF,6+ Mos 03/25/2015, 03/22/2016, 03/27/2017, 03/11/2018, 04/02/2019   Influenza,inj,quad, With Preservative 03/23/2010, 03/25/2015, 03/22/2016, 03/27/2017, 03/11/2018, 04/02/2019   Influenza-Unspecified 03/23/2010, 03/23/2010, 03/30/2011, 03/30/2011, 04/04/2012, 04/04/2012, 04/16/2013, 04/16/2013, 03/26/2014, 03/26/2014, 03/25/2015, 03/25/2015, 03/25/2015, 03/22/2016, 03/22/2016, 03/22/2016, 03/27/2017, 03/27/2017, 03/27/2017, 03/11/2018, 03/11/2018, 03/11/2018, 04/02/2019, 04/02/2019, 04/02/2019   MMR 01/21/2002, 01/10/2006   Meningococcal Acwy, Unspecified 03/30/2011   Meningococcal Conjugate 03/30/2011, 03/22/2016   PPD Test 01/26/2022, 01/26/2022, 04/25/2022, 04/25/2022   Pneumococcal Conjugate PCV 7 06/04/2000, 08/05/2000, 10/07/2000   Pneumococcal Conjugate,unspecified 06/04/2000, 08/05/2000, 10/07/2000   Pneumococcal Conjugate-13 06/04/2000, 08/05/2000, 10/07/2000   Tdap 03/30/2011, 07/22/2019, 03/24/2024   Varicella 05/23/2001, 01/10/2006    Physical exam  Vitals:   06/02/24 1632 06/02/24 1700 06/02/24 1730 06/02/24 1800  BP: 98/60 117/71 126/78 131/85  Pulse: 72 79 74 80  Resp:      Temp: (!) 97.5 F (36.4 C)     TempSrc: Oral     SpO2:      Weight:      Height:       General: {Exam; general:21111117} Lochia: {Desc; appropriate/inappropriate:30686::appropriate} Uterine Fundus: {Desc; firm/soft:30687} Incision: {Exam; incision:21111123} DVT Evaluation: {Exam; dvt:2111122} Labs: Lab Results  Component Value Date   WBC  10.3 06/02/2024   HGB 13.0 06/02/2024   HCT 37.1 06/02/2024   MCV 90.0 06/02/2024  PLT 151 06/02/2024      Latest Ref Rng & Units 02/18/2024   12:17 PM  CMP  Glucose 70 - 99 mg/dL 876   BUN 6 - 20 mg/dL 5   Creatinine 9.55 - 8.99 mg/dL 9.49   Sodium 864 - 854 mmol/L 136   Potassium 3.5 - 5.1 mmol/L 3.8   Chloride 98 - 111 mmol/L 102   CO2 22 - 32 mmol/L 20   Calcium 8.9 - 10.3 mg/dL 9.0   Total Protein 6.5 - 8.1 g/dL 6.6   Total Bilirubin 0.0 - 1.2 mg/dL 0.3   Alkaline Phos 38 - 126 U/L 82   AST 15 - 41 U/L 13   ALT 0 - 44 U/L 8    Edinburgh Score:     No data to display         No data recorded  After visit meds:  Allergies as of 06/02/2024       Reactions   Latex Dermatitis, Itching     Med Rec must be completed prior to using this [facility]***        Discharge home in stable condition Infant Feeding: Breast and formula Infant Disposition:{CHL IP OB HOME WITH FNUYZM:76418} Discharge instruction: per After Visit Summary and Postpartum booklet. Activity: Advance as tolerated. Pelvic rest for 6 weeks.  Diet: routine diet Future Appointments: Future Appointments  Date Time Provider Department Center  06/04/2024  3:55 PM Nicholaus Burnard HERO, MD [facility]  06/11/2024  1:15 PM Cleatus Moccasin, MD [facility]   Follow up Visit:  Message sent for [facility] 1/20 by [facility] Please schedule this patient for a In person postpartum visit in 6 weeks with the following provider: Any provider. Additional Postpartum F/U:None  Low risk pregnancy complicated by: hx preE Delivery mode: SVD Anticipated Birth Control:  Unsure   06/02/2024 Kieth JAYSON Carolin, MD    "

## 2024-06-03 NOTE — Progress Notes (Signed)
 POSTPARTUM PROGRESS NOTE  Post Partum Day 1  Subjective:  Miranda Hayes is a 24 y.o. H7E8897 s/p vaginal delivery at [redacted]w[redacted]d.  No acute events overnight.  Pt denies problems with ambulating, voiding or po intake.  She denies nausea or vomiting.  Pain is well controlled.  She has had flatus. She has not had bowel movement.  Lochia Moderate. Denies headaches, vision changes, RUQ pain.  Objective: Blood pressure 107/66, pulse 72, temperature 98.7 F (37.1 C), temperature source Oral, resp. rate 18, height 5' 6 (1.676 m), weight 117.3 kg, last menstrual period 09/03/2023, SpO2 99%, unknown if currently breastfeeding.  Physical Exam:  General: alert, cooperative and no distress Chest: no respiratory distress Heart:regular rate, distal pulses intact Abdomen: soft, nontender,  Uterine Fundus: firm, appropriately tender DVT Evaluation: No calf swelling or tenderness Extremities: mild bilateral lower extremity edema Skin: warm, dry, well perfused;  Recent Labs    06/02/24 0649  HGB 13.0  HCT 37.1    Assessment/Plan: Miranda Hayes is a 24 y.o. H7E8897 s/p vaginal delivery at [redacted]w[redacted]d   PPD#1 - Doing well Contraception: Wants progestin based IUD, as this worked for her after previous pregnancy Feeding: Contemplating breastfeeding, did try, also bottle feeding. Dispo: Plan for discharge in 1 day.   LOS: 1 day   Luie Siegel, Medical Student 06/03/2024, 7:09 AM    ATTENDING ATTESTATION  I have seen and examined this patient and agree with the above documentation in the medical student's note except as below.  Agree with above  Donnice CHRISTELLA Carolus, MD/MPH Center for [employer] (Faculty Practice) 06/03/2024, 2:31 PM

## 2024-06-03 NOTE — Lactation Note (Signed)
 This note was copied from a baby's chart. Lactation Consultation Note  Patient Name: Miranda Hayes Unijb'd Date: 06/03/2024 Age:25 hours Reason for consult: Initial assessment;Term  P2- MOB would like to exclusively pump and offer formula. MOB does not want infant to latch to the breast. MOB did not have the pump set up, so LC set up the hospital DEBP with 18 mm flanges. MOB requested coconut oil as well. LC reviewed the DEBP and how to clean it. LC encouraged MOB to pump every 3 hrs to stimulate the breasts. MOB denied having any questions or concern at this time. MOB did not want to pump at this time. LC sent [facility] referral.   LC reviewed the first 24 hr birthday nap, day 2 cluster feeding, feeding infant on cue 8-12x in 24 hrs, not allowing infant to go over 3 hrs without a feeding, CDC milk storage guidelines, LC services handout and engorgement/breast care. LC encouraged MOB to call for further assistance as needed.  Maternal Data Has patient been taught Hand Expression?: No Does the patient have breastfeeding experience prior to this delivery?: Yes How long did the patient breastfeed?: 3 months then stopped due to mastitis  Feeding Mother's Current Feeding Choice: Breast Milk and Formula (pump only)  Lactation Tools Discussed/Used Tools: Pump;Flanges;Coconut oil Flange Size: 18 Breast pump type: Double-Electric Breast Pump;Manual Pump Education: Setup, frequency, and cleaning;Milk Storage Reason for Pumping: exclusive pumping Pumping frequency: 15-20 min every 3 hrs  Interventions Interventions: Breast feeding basics reviewed;Hand pump;DEBP;Coconut oil;Education;LC Services brochure  Discharge Discharge Education: Engorgement and breast care;Warning signs for feeding baby Pump: Manual;Hands Free;Personal;Referral sent for [facility] Pump  Consult Status Consult Status: Follow-up Date: 06/04/24 Follow-up type: In-patient    Recardo Hoit BS, IBCLC 06/03/2024, 5:17 PM

## 2024-06-03 NOTE — Anesthesia Postprocedure Evaluation (Signed)
"   Anesthesia Post Note  Patient: Miranda Hayes  Procedure(s) Performed: AN AD HOC LABOR EPIDURAL     Patient location during evaluation: Mother Baby Anesthesia Type: Epidural Level of consciousness: awake and alert Pain management: pain level controlled Vital Signs Assessment: post-procedure vital signs reviewed and stable Respiratory status: spontaneous breathing, nonlabored ventilation and respiratory function stable Cardiovascular status: stable Postop Assessment: no headache, no backache and epidural receding Anesthetic complications: no   No notable events documented.  Last Vitals:  Vitals:   06/03/24 0203 06/03/24 0555  BP: 110/69 107/66  Pulse: 84 72  Resp: 18 18  Temp: 36.9 C 37.1 C  SpO2: 99% 99%    Last Pain:  Vitals:   06/03/24 0750  TempSrc:   PainSc: 2    Pain Goal:                   Miranda Hayes      "

## 2024-06-04 ENCOUNTER — Encounter: Payer: Self-pay | Admitting: Obstetrics and Gynecology

## 2024-06-04 MED ORDER — ACETAMINOPHEN 500 MG PO TABS: 1000.0000 mg | ORAL_TABLET | Freq: Four times a day (QID) | ORAL | Status: AC | PRN

## 2024-06-04 MED ORDER — IBUPROFEN 800 MG PO TABS: 800.0000 mg | ORAL_TABLET | Freq: Three times a day (TID) | ORAL | 2 refills | Status: AC | PRN

## 2024-06-04 NOTE — Social Work (Signed)
 CSW received consult for hx of marijuana use.  Referral was screened out due to the following: ~MOB had no documented substance use after initial prenatal visit/+UPT. ~MOB had no positive drug screens after initial prenatal visit/+UPT. ~Baby's UDS is negative. (Not collected)  Please consult CSW if current concerns arise or by MOB's request.  CSW will monitor CDS results and make report to Child Protective Services if warranted.  MOB was referred for history of depression/anxiety.  * Referral screened out by Clinical Social Worker because none of the following criteria appear to apply:  ~ History of anxiety/depression during this pregnancy, or of post-partum depression following prior delivery.  ~ Diagnosis of anxiety and/or depression within last 3 years OR * MOB's symptoms currently being treated with medication and/or therapy. Per chart MOB has an active prescription for Zoloft 100mg , Edinburgh=0 Please contact the Clinical Social Worker if needs arise, by MOB request.   Eliazar Gave, LCSWA Clinical Social Worker [phone]

## 2024-06-04 NOTE — Lactation Note (Signed)
 This note was copied from a baby's chart. Lactation Consultation Note  Patient Name: Miranda Hayes Unijb'd Date: 06/04/2024 Age:26 hours  Reason for consult: Follow-up assessment;Exclusive pumping and bottle feeding  Follow up LC visit. Mother is pumping only and supplementing baby with formula until she is able to express her milk. She reports she did the same with her other child but did not produce a lot of breast milk. She fed EBM and formula.   Discussed pumping every 3 hrs for 15 minutes and lots of skin to skin. Mom made aware of O/P services, breastfeeding support groups, community resources, and phone # for post-discharge questions.  OP LC from [facility] has rounded and informed mother of breastfeeding support services available.   Discussed prevention and management of engorgement. Mother reports she has experienced engorgement and mastitis. Advised to regularly pump and  frequent milk removal with gradual weaning to avoid breast problem.    Feeding Mother's Current Feeding Choice: Formula Nipple Type: Slow - flow   Interventions Interventions: Education  Discharge Discharge Education: Engorgement and breast care;Warning signs for feeding baby Pump: Received Stork Pump (Spectra  pump in the room, brought in by her nurse, receipt signed)  Consult Status Consult Status: Complete Date: 06/04/24    Joshua Rojelio HERO 06/04/2024, 1:11 PM

## 2024-06-04 NOTE — Patient Instructions (Signed)
 If you are interested in an outpatient lactation consultation -- available in-office or virtually -- please reach out to us  at:  [facility] (First Floor) ?? [address], [place], [place]  ?? [phone] Please leave a message on our lactation voicemail box. We welcome any lactation-related questions or concerns -- our team is here to support you and your baby.  Lactation Support Groups Join us  at: [facility] ?? Tuesdays, 10:00 AM - 12:00 PM ?? [address], [employer], [employer]  Lactating parents and lap babies are welcome!  ?? [email or web address]  ?? [email or web address]  Si est interesado en una consulta ambulatoria de lactancia, disponible en el consultorio o virtualmente, comunquese con nosotros en:  [facility] ?? [address], [place], [place]  ?? [phone] Por favor, deje un mensaje en nuestro buzn de voz de lactancia. Estamos aqu para responder cualquier pregunta o inquietud relacionada con la lactancia y para apoyarle a usted y a su beb.  Grupos de Apoyo para la Lactancia nase a nosotros en: [facility] ?? Martes, de 10:00 a. m. a 12:00 p. m. ?? [address], [place], [place]  Se admiten madres lactantes y bebs en regazo.  ?? [email or web address]  ?? [email or web address]      Miranda Hayes, [facility]

## 2024-06-04 NOTE — Discharge Instructions (Signed)
 SABRA

## 2024-06-11 ENCOUNTER — Telehealth (HOSPITAL_COMMUNITY): Payer: Self-pay | Admitting: *Deleted

## 2024-06-11 ENCOUNTER — Encounter: Payer: Self-pay | Admitting: Obstetrics and Gynecology

## 2024-06-11 NOTE — Telephone Encounter (Signed)
 06/11/2024  Name: Miranda Hayes MRN: [id number] DOB: 2001-03-15  Reason for Call:  Transition of Care Hospital Discharge Call  Contact Status: Patient Contact Status: Complete  Language assistant needed: Interpreter Mode: Interpreter Not Needed        Follow-Up Questions: Do You Have Any Concerns About Your Health As You Heal From Delivery?: No Do You Have Any Concerns About Your Infants Health?: No  Edinburgh Postnatal Depression Scale:  In the Past 7 Days:    PHQ2-9 Depression Scale:     Discharge Follow-up: Edinburgh score requires follow up?:  (Patient says answers are the same as in the hospital when score was 0. Patient endorses she is doing well emotionally.) Patient was advised of the following resources:: Support Group, Breastfeeding Support Group (declines postpartum group information via email)  Post-discharge interventions: Reviewed Newborn Safe Sleep Practices  Mliss Sieve, RN 06/11/2024 15:40

## 2024-06-14 ENCOUNTER — Inpatient Hospital Stay (HOSPITAL_COMMUNITY)

## 2024-07-15 ENCOUNTER — Ambulatory Visit: Payer: Self-pay | Admitting: Obstetrics and Gynecology
